# Patient Record
Sex: Female | Born: 1960 | Race: Black or African American | Hispanic: No | Marital: Married | Smoking: Former smoker
Health system: Southern US, Community
[De-identification: ages and names within clinical notes are randomized; demographics above are authoritative.]

## PROBLEM LIST (undated history)

## (undated) DIAGNOSIS — E785 Hyperlipidemia, unspecified: Secondary | ICD-10-CM

## (undated) DIAGNOSIS — I10 Essential (primary) hypertension: Secondary | ICD-10-CM

## (undated) DIAGNOSIS — R32 Unspecified urinary incontinence: Secondary | ICD-10-CM

## (undated) DIAGNOSIS — J45909 Unspecified asthma, uncomplicated: Secondary | ICD-10-CM

## (undated) DIAGNOSIS — K284 Chronic or unspecified gastrojejunal ulcer with hemorrhage: Secondary | ICD-10-CM

## (undated) DIAGNOSIS — Z9289 Personal history of other medical treatment: Secondary | ICD-10-CM

## (undated) DIAGNOSIS — R519 Headache, unspecified: Secondary | ICD-10-CM

## (undated) DIAGNOSIS — J189 Pneumonia, unspecified organism: Secondary | ICD-10-CM

## (undated) DIAGNOSIS — I82409 Acute embolism and thrombosis of unspecified deep veins of unspecified lower extremity: Secondary | ICD-10-CM

## (undated) DIAGNOSIS — K274 Chronic or unspecified peptic ulcer, site unspecified, with hemorrhage: Secondary | ICD-10-CM

## (undated) DIAGNOSIS — R51 Headache: Secondary | ICD-10-CM

## (undated) DIAGNOSIS — J439 Emphysema, unspecified: Secondary | ICD-10-CM

## (undated) HISTORY — DX: Personal history of other medical treatment: Z92.89

## (undated) HISTORY — DX: Chronic or unspecified peptic ulcer, site unspecified, with hemorrhage: K27.4

## (undated) HISTORY — PX: TONSILLECTOMY: SUR1361

## (undated) HISTORY — DX: Headache, unspecified: R51.9

## (undated) HISTORY — PX: LAPAROSCOPIC GASTRIC SLEEVE RESECTION: SHX5895

## (undated) HISTORY — PX: TUBAL LIGATION: SHX77

## (undated) HISTORY — PX: GASTRECTOMY: SHX58

## (undated) HISTORY — DX: Acute embolism and thrombosis of unspecified deep veins of unspecified lower extremity: I82.409

## (undated) HISTORY — DX: Emphysema, unspecified: J43.9

## (undated) HISTORY — PX: FOOT SURGERY: SHX648

## (undated) HISTORY — DX: Headache: R51

## (undated) HISTORY — DX: Chronic or unspecified gastrojejunal ulcer with hemorrhage: K28.4

## (undated) HISTORY — DX: Unspecified urinary incontinence: R32

---

## 1995-06-05 DIAGNOSIS — I82409 Acute embolism and thrombosis of unspecified deep veins of unspecified lower extremity: Secondary | ICD-10-CM

## 1995-06-05 HISTORY — DX: Acute embolism and thrombosis of unspecified deep veins of unspecified lower extremity: I82.409

## 2011-10-24 ENCOUNTER — Ambulatory Visit (HOSPITAL_BASED_OUTPATIENT_CLINIC_OR_DEPARTMENT_OTHER)
Admission: RE | Admit: 2011-10-24 | Discharge: 2011-10-24 | Disposition: A | Discharge status: Home / Self Care | Payer: BC Managed Care – PPO | Source: Ambulatory Visit | Attending: Internal Medicine | Admitting: Internal Medicine

## 2011-10-24 ENCOUNTER — Other Ambulatory Visit (HOSPITAL_BASED_OUTPATIENT_CLINIC_OR_DEPARTMENT_OTHER): Payer: Self-pay | Admitting: Internal Medicine

## 2011-10-24 DIAGNOSIS — M79606 Pain in leg, unspecified: Secondary | ICD-10-CM

## 2011-10-24 DIAGNOSIS — M25579 Pain in unspecified ankle and joints of unspecified foot: Secondary | ICD-10-CM | POA: Insufficient documentation

## 2011-10-24 DIAGNOSIS — M25476 Effusion, unspecified foot: Secondary | ICD-10-CM | POA: Insufficient documentation

## 2011-10-24 DIAGNOSIS — M25473 Effusion, unspecified ankle: Secondary | ICD-10-CM | POA: Insufficient documentation

## 2012-05-05 ENCOUNTER — Other Ambulatory Visit (HOSPITAL_COMMUNITY)
Admission: RE | Admit: 2012-05-05 | Discharge: 2012-05-05 | Disposition: A | Discharge status: Home / Self Care | Payer: BC Managed Care – PPO | Source: Ambulatory Visit | Attending: Obstetrics and Gynecology | Admitting: Obstetrics and Gynecology

## 2012-05-05 DIAGNOSIS — Z1151 Encounter for screening for human papillomavirus (HPV): Secondary | ICD-10-CM | POA: Insufficient documentation

## 2012-05-05 DIAGNOSIS — Z01419 Encounter for gynecological examination (general) (routine) without abnormal findings: Secondary | ICD-10-CM | POA: Insufficient documentation

## 2013-04-13 DIAGNOSIS — E782 Mixed hyperlipidemia: Secondary | ICD-10-CM | POA: Insufficient documentation

## 2013-04-13 DIAGNOSIS — I10 Essential (primary) hypertension: Secondary | ICD-10-CM | POA: Insufficient documentation

## 2013-04-13 DIAGNOSIS — I152 Hypertension secondary to endocrine disorders: Secondary | ICD-10-CM | POA: Insufficient documentation

## 2013-04-13 DIAGNOSIS — J4520 Mild intermittent asthma, uncomplicated: Secondary | ICD-10-CM | POA: Insufficient documentation

## 2013-08-14 ENCOUNTER — Other Ambulatory Visit: Payer: Self-pay | Admitting: Internal Medicine

## 2013-08-14 DIAGNOSIS — Z1231 Encounter for screening mammogram for malignant neoplasm of breast: Secondary | ICD-10-CM

## 2013-08-14 DIAGNOSIS — Z0000 Encounter for general adult medical examination without abnormal findings: Secondary | ICD-10-CM

## 2014-07-16 DIAGNOSIS — Z9884 Bariatric surgery status: Secondary | ICD-10-CM | POA: Insufficient documentation

## 2014-10-05 DIAGNOSIS — E559 Vitamin D deficiency, unspecified: Secondary | ICD-10-CM | POA: Insufficient documentation

## 2015-07-01 ENCOUNTER — Other Ambulatory Visit: Payer: Self-pay | Admitting: Obstetrics and Gynecology

## 2015-07-01 ENCOUNTER — Other Ambulatory Visit (HOSPITAL_COMMUNITY)
Admission: RE | Admit: 2015-07-01 | Discharge: 2015-07-01 | Disposition: A | Discharge status: Home / Self Care | Payer: BLUE CROSS/BLUE SHIELD | Source: Ambulatory Visit | Attending: Obstetrics and Gynecology | Admitting: Obstetrics and Gynecology

## 2015-07-01 DIAGNOSIS — Z01419 Encounter for gynecological examination (general) (routine) without abnormal findings: Secondary | ICD-10-CM | POA: Insufficient documentation

## 2015-07-01 DIAGNOSIS — Z1151 Encounter for screening for human papillomavirus (HPV): Secondary | ICD-10-CM | POA: Insufficient documentation

## 2015-07-04 LAB — CYTOLOGY - PAP

## 2015-07-13 ENCOUNTER — Encounter (HOSPITAL_BASED_OUTPATIENT_CLINIC_OR_DEPARTMENT_OTHER): Payer: Self-pay

## 2015-07-13 ENCOUNTER — Emergency Department (HOSPITAL_BASED_OUTPATIENT_CLINIC_OR_DEPARTMENT_OTHER)
Admission: EM | Admit: 2015-07-13 | Discharge: 2015-07-14 | Disposition: A | Discharge status: Home / Self Care | Payer: BLUE CROSS/BLUE SHIELD | Attending: Emergency Medicine | Admitting: Emergency Medicine

## 2015-07-13 ENCOUNTER — Emergency Department (HOSPITAL_BASED_OUTPATIENT_CLINIC_OR_DEPARTMENT_OTHER): Payer: BLUE CROSS/BLUE SHIELD

## 2015-07-13 DIAGNOSIS — Z8639 Personal history of other endocrine, nutritional and metabolic disease: Secondary | ICD-10-CM | POA: Diagnosis not present

## 2015-07-13 DIAGNOSIS — R079 Chest pain, unspecified: Secondary | ICD-10-CM | POA: Diagnosis not present

## 2015-07-13 DIAGNOSIS — I10 Essential (primary) hypertension: Secondary | ICD-10-CM | POA: Diagnosis not present

## 2015-07-13 DIAGNOSIS — J45901 Unspecified asthma with (acute) exacerbation: Secondary | ICD-10-CM | POA: Diagnosis not present

## 2015-07-13 DIAGNOSIS — Z87891 Personal history of nicotine dependence: Secondary | ICD-10-CM | POA: Insufficient documentation

## 2015-07-13 DIAGNOSIS — Z8701 Personal history of pneumonia (recurrent): Secondary | ICD-10-CM | POA: Insufficient documentation

## 2015-07-13 HISTORY — DX: Pneumonia, unspecified organism: J18.9

## 2015-07-13 HISTORY — DX: Essential (primary) hypertension: I10

## 2015-07-13 HISTORY — DX: Unspecified asthma, uncomplicated: J45.909

## 2015-07-13 HISTORY — DX: Hyperlipidemia, unspecified: E78.5

## 2015-07-13 LAB — CBC
HCT: 40.6 % (ref 36.0–46.0)
HEMOGLOBIN: 13.2 g/dL (ref 12.0–15.0)
MCH: 26.2 pg (ref 26.0–34.0)
MCHC: 32.5 g/dL (ref 30.0–36.0)
MCV: 80.6 fL (ref 78.0–100.0)
PLATELETS: 300 10*3/uL (ref 150–400)
RBC: 5.04 MIL/uL (ref 3.87–5.11)
RDW: 14.4 % (ref 11.5–15.5)
WBC: 6.6 10*3/uL (ref 4.0–10.5)

## 2015-07-13 LAB — BASIC METABOLIC PANEL
ANION GAP: 9 (ref 5–15)
BUN: 27 mg/dL — ABNORMAL HIGH (ref 6–20)
CALCIUM: 9.4 mg/dL (ref 8.9–10.3)
CO2: 26 mmol/L (ref 22–32)
CREATININE: 0.76 mg/dL (ref 0.44–1.00)
Chloride: 106 mmol/L (ref 101–111)
GLUCOSE: 120 mg/dL — AB (ref 65–99)
Potassium: 3.3 mmol/L — ABNORMAL LOW (ref 3.5–5.1)
Sodium: 141 mmol/L (ref 135–145)

## 2015-07-13 LAB — TROPONIN I

## 2015-07-13 MED ORDER — KETOROLAC TROMETHAMINE 30 MG/ML IJ SOLN
30.0000 mg | Freq: Once | INTRAMUSCULAR | Status: AC
  Administered 2015-07-13: 30 mg via INTRAVENOUS
  Filled 2015-07-13: qty 1

## 2015-07-13 NOTE — ED Notes (Signed)
MD at bedside. 

## 2015-07-13 NOTE — ED Notes (Signed)
Pt c/o intermittent left side chest pain and burning with SOB and dizziness for the last two weeks

## 2015-07-13 NOTE — ED Provider Notes (Signed)
CSN: [id number]     Arrival date & time 07/13/15  2000 History   First MD Initiated Contact with Patient 07/13/15 2232     Chief Complaint  Patient presents with  . Chest Pain    HPI   55 year old female presents today with complaints of chest pain. She reports she's had symptoms off and on for the last 2 weeks. She reports that they come and go, not associated with any significant changes. In health status. She reports most recent episode was Saturday, reports that she had some shortness of breath when walking, and sharp burning sensation in her substernal region. Denies any exacerbating activities, nothing making it better. Patient had not tried any medications for the pain. She denies any abdominal pain, belching, change in stool color. Patient notes she has a history of pleurisy, reports that this felt somewhat similar and assumed it would go away on its own. She states that symptoms resolved again after this weekend, but then again returned today. She denies any fever, chills, productive cough, lotion swelling or edema. Patient denies smoking history, no estrogen, history of DVT with pregnancy, no recent surgery, or any other risk factors for DVT PE.   Past Medical History  Diagnosis Date  . Pneumonia   . Asthma   . Hyperlipidemia   . Hypertension    Past Surgical History  Procedure Laterality Date  . Gastrectomy    . Foot surgery    . Tubal ligation     No family history on file. Social History  Substance Use Topics  . Smoking status: Former Games developer  . Smokeless tobacco: None  . Alcohol Use: Yes   OB History    No data available     Review of Systems  All other systems reviewed and are negative.   Allergies  Sulfa antibiotics  Home Medications   Prior to Admission medications   Not on File   BP 115/81 mmHg  Pulse 58  Temp(Src) 98.1 F (36.7 C) (Oral)  Resp 18  Ht  (1.727 m)  Wt 98.884 kg  BMI 33.15 kg/m2  SpO2 95% Physical Exam  Constitutional: She is  oriented to person, place, and time. She appears well-developed and well-nourished.  HENT:  Head: Normocephalic and atraumatic.  Eyes: Conjunctivae are normal. Pupils are equal, round, and reactive to light. Right eye exhibits no discharge. Left eye exhibits no discharge. No scleral icterus.  Neck: Normal range of motion. No JVD present. No tracheal deviation present.  Cardiovascular: Normal rate, regular rhythm, normal heart sounds and intact distal pulses.  Exam reveals no gallop and no friction rub.   No murmur heard. Pulmonary/Chest: Effort normal and breath sounds normal. No stridor. No respiratory distress. She has no wheezes. She has no rales. She exhibits no tenderness.  Abdominal: Soft. She exhibits no distension and no mass. There is no tenderness. There is no rebound and no guarding.  Musculoskeletal:  Lower extremities equal bilateral, nontender to palpation  Neurological: She is alert and oriented to person, place, and time. Coordination normal.  Skin: Skin is warm and dry. No rash noted. No erythema. No pallor.  Psychiatric: She has a normal mood and affect. Her behavior is normal. Judgment and thought content normal.  Nursing note and vitals reviewed.   ED Course  Procedures (including critical care time) Labs Review Labs Reviewed  BASIC METABOLIC PANEL - Abnormal; Notable for the following:    Potassium 3.3 (*)    Glucose, Bld 120 (*)  BUN 27 (*)    All other components within normal limits  CBC  TROPONIN I    Imaging Review Dg Chest 2 View  07/13/2015  CLINICAL DATA:  Chest and LEFT-sided neck pain with cough for 4 days. EXAM: CHEST  2 VIEW COMPARISON:  None. FINDINGS: The heart size and mediastinal contours are within normal limits. Both lungs are clear. The visualized skeletal structures are unremarkable. IMPRESSION: No active cardiopulmonary disease. Electronically Signed   By: Elsie Stain M.D.   On: 07/13/2015 21:16   I have personally reviewed and evaluated  these images and lab results as part of my medical decision-making.   EKG Interpretation None      MDM   Final diagnoses:  Chest pain, unspecified chest pain type    Labs: BMP, CBC, troponin- no significant findings  Imaging: DG chest 2 view negative  Consults:  Therapeutics: Toradol  Discharge Meds:   Assessment/Plan: 55 year old female presents today with chest pain. This is been intermittent over the last several weeks, history of the same prior. She was treated here in the ED with Toradol with symptomatic improvement. She has a negative troponin, normal EKG, negative chest x-ray. She has a heart scores 3, well score of 1.5. I have very low suspicion for any cardiac or pulmonary etiology for patient's pain today. Pain was not severe enough that patient attempted any over-the-counter medications prior to arrival. Patient will be discharged home with instructions follow-up with her primary care provider for reevaluation. She is given strict return precautions, verbalized understanding and agreement for today's plan.           Eyvonne Mechanic, PA-C 07/14/15 0107  Loren Racer, MD 07/16/15 2249

## 2015-07-14 NOTE — Discharge Instructions (Signed)
Chest Wall Pain Chest wall pain is pain in or around the bones and muscles of your chest. Sometimes, an injury causes this pain. Sometimes, the cause may not be known. This pain may take several weeks or longer to get better. HOME CARE Pay attention to any changes in your symptoms. Take these actions to help with your pain:  Rest as told by your doctor.  Avoid activities that cause pain. Try not to use your chest, belly (abdominal), or side muscles to lift heavy things.  If directed, apply ice to the painful area:  Put ice in a plastic bag.  Place a towel between your skin and the bag.  Leave the ice on for 20 minutes, 2-3 times per day.  Take over-the-counter and prescription medicines only as told by your doctor.  Do not use tobacco products, including cigarettes, chewing tobacco, and e-cigarettes. If you need help quitting, ask your doctor.  Keep all follow-up visits as told by your doctor. This is important. GET HELP IF:  You have a fever.  Your chest pain gets worse.  You have new symptoms. GET HELP RIGHT AWAY IF:  You feel sick to your stomach (nauseous) or you throw up (vomit).  You feel sweaty or light-headed.  You have a cough with phlegm (sputum) or you cough up blood.  You are short of breath.   This information is not intended to replace advice given to you by your health care provider. Make sure you discuss any questions you have with your health care provider.   Document Released: 11/07/2007 Document Revised: 02/09/2015 Document Reviewed: 08/16/2014 Elsevier Interactive Patient Education 2016 Else[employer]  Chest Wall Pain Chest wall pain is pain in or around the bones and muscles of your chest. Sometimes, an injury causes this pain. Sometimes, the cause may not be known. This pain may take several weeks or longer to get better. HOME CARE Pay attention to any changes in your symptoms. Take these actions to help with your pain:  Rest as told by your  doctor.  Avoid activities that cause pain. Try not to use your chest, belly (abdominal), or side muscles to lift heavy things.  If directed, apply ice to the painful area:  Put ice in a plastic bag.  Place a towel between your skin and the bag.  Leave the ice on for 20 minutes, 2-3 times per day.  Take over-the-counter and prescription medicines only as told by your doctor.  Do not use tobacco products, including cigarettes, chewing tobacco, and e-cigarettes. If you need help quitting, ask your doctor.  Keep all follow-up visits as told by your doctor. This is important. GET HELP IF:  You have a fever.  Your chest pain gets worse.  You have new symptoms. GET HELP RIGHT AWAY IF:  You feel sick to your stomach (nauseous) or you throw up (vomit).  You feel sweaty or light-headed.  You have a cough with phlegm (sputum) or you cough up blood.  You are short of breath.   This information is not intended to replace advice given to you by your health care provider. Make sure you discuss any questions you have with your health care provider.   Document Released: 11/07/2007 Document Revised: 02/09/2015 Document Reviewed: 08/16/2014 Elsevier Interactive Patient Education [employer]

## 2015-07-18 ENCOUNTER — Other Ambulatory Visit: Payer: Self-pay | Admitting: Obstetrics and Gynecology

## 2015-11-30 ENCOUNTER — Ambulatory Visit: Payer: BLUE CROSS/BLUE SHIELD | Admitting: Allergy and Immunology

## 2015-12-07 ENCOUNTER — Other Ambulatory Visit: Payer: Self-pay | Admitting: Obstetrics and Gynecology

## 2016-02-09 ENCOUNTER — Other Ambulatory Visit: Payer: Self-pay | Admitting: Internal Medicine

## 2016-02-09 DIAGNOSIS — M5442 Lumbago with sciatica, left side: Principal | ICD-10-CM

## 2016-02-09 DIAGNOSIS — M5441 Lumbago with sciatica, right side: Secondary | ICD-10-CM

## 2016-02-17 ENCOUNTER — Other Ambulatory Visit: Payer: BLUE CROSS/BLUE SHIELD

## 2016-02-24 ENCOUNTER — Ambulatory Visit (HOSPITAL_COMMUNITY): Admission: RE | Admit: 2016-02-24 | Payer: BLUE CROSS/BLUE SHIELD | Source: Ambulatory Visit

## 2016-06-04 HISTORY — PX: DILATION AND CURETTAGE OF UTERUS: SHX78

## 2016-06-05 DIAGNOSIS — M5116 Intervertebral disc disorders with radiculopathy, lumbar region: Secondary | ICD-10-CM | POA: Insufficient documentation

## 2016-07-02 ENCOUNTER — Other Ambulatory Visit (HOSPITAL_COMMUNITY)
Admission: RE | Admit: 2016-07-02 | Discharge: 2016-07-02 | Disposition: A | Discharge status: Home / Self Care | Payer: BLUE CROSS/BLUE SHIELD | Source: Ambulatory Visit | Attending: Obstetrics and Gynecology | Admitting: Obstetrics and Gynecology

## 2016-07-02 ENCOUNTER — Other Ambulatory Visit: Payer: Self-pay | Admitting: Obstetrics and Gynecology

## 2016-07-02 DIAGNOSIS — Z1151 Encounter for screening for human papillomavirus (HPV): Secondary | ICD-10-CM | POA: Insufficient documentation

## 2016-07-02 DIAGNOSIS — Z113 Encounter for screening for infections with a predominantly sexual mode of transmission: Secondary | ICD-10-CM | POA: Insufficient documentation

## 2016-07-02 DIAGNOSIS — Z01419 Encounter for gynecological examination (general) (routine) without abnormal findings: Secondary | ICD-10-CM | POA: Insufficient documentation

## 2016-07-04 LAB — CYTOLOGY - PAP
Bacterial vaginitis: POSITIVE — AB
DIAGNOSIS: NEGATIVE
HPV (WINDOPATH): NOT DETECTED

## 2016-08-28 ENCOUNTER — Other Ambulatory Visit: Payer: Self-pay | Admitting: Obstetrics and Gynecology

## 2017-07-03 ENCOUNTER — Other Ambulatory Visit: Payer: Self-pay | Admitting: Obstetrics and Gynecology

## 2017-07-03 ENCOUNTER — Other Ambulatory Visit (HOSPITAL_COMMUNITY)
Admission: RE | Admit: 2017-07-03 | Discharge: 2017-07-03 | Disposition: A | Discharge status: Home / Self Care | Payer: BLUE CROSS/BLUE SHIELD | Source: Ambulatory Visit | Attending: Obstetrics and Gynecology | Admitting: Obstetrics and Gynecology

## 2017-07-03 DIAGNOSIS — Z124 Encounter for screening for malignant neoplasm of cervix: Secondary | ICD-10-CM | POA: Diagnosis not present

## 2017-07-04 LAB — CYTOLOGY - PAP
Diagnosis: NEGATIVE
HPV: NOT DETECTED

## 2017-07-29 ENCOUNTER — Ambulatory Visit (INDEPENDENT_AMBULATORY_CARE_PROVIDER_SITE_OTHER): Payer: BLUE CROSS/BLUE SHIELD | Admitting: Family Medicine

## 2017-07-29 ENCOUNTER — Ambulatory Visit (INDEPENDENT_AMBULATORY_CARE_PROVIDER_SITE_OTHER): Payer: BLUE CROSS/BLUE SHIELD

## 2017-07-29 ENCOUNTER — Encounter: Payer: Self-pay | Admitting: Family Medicine

## 2017-07-29 DIAGNOSIS — M25562 Pain in left knee: Secondary | ICD-10-CM | POA: Diagnosis not present

## 2017-07-29 MED ORDER — DICLOFENAC SODIUM 75 MG PO TBEC: 75.0000 mg | DELAYED_RELEASE_TABLET | Freq: Two times a day (BID) | ORAL | 0 refills | Status: DC

## 2017-07-29 NOTE — Assessment & Plan Note (Signed)
Chronic, intermittent issue. Probable internal derangement. Xray today. Refer to Dr. Jordan LikesSchmitz- re:  Further evaluation, ? Cortisone injections. The patient indicates understanding of these issues and agrees with the plan.

## 2017-07-29 NOTE — Progress Notes (Signed)
Subjective:   Patient ID: Melinda Bruce, female    DOB: Dec 05, 1960, 57 y.o.   MRN: [id number]  Yvonda Fouty is a pleasant 57 y.o. year old female who presents to clinic today with New Patient (Initial Visit) (Patient is here today to establish care as a new patient. She had flu shot in Oct with work.  Her last Colonoscopy was approx 12-years-ago and had a bleeding ulcer in [place].  Records have been requested.) and Knee Pain (Patient is C/O left knee pain.  She went to an Urgent Care 2.6.19 for the pain.  It was painful to touch and inflammed. Tx with Diclofenac 75mg  1qd x14d which helped but it is now back.  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  Pain started yeterday and she took Meloxicam last hs which helped some.  States there is a knot on the medial side. )  on 07/29/2017  HPI:  Left knee pain- went to [facility] on 07/10/17 for this complaint. Note reviewed. At that time, had 4 days of left knee pain and swelling. No known injury.   Sent home with 14 day course of oral and topical Voltaren. Completed course of oral, did not pick up the voltaren- insurance issue. Advised to follow up with ortho.  Pain restarted again yesterday- took some meloxicam which did help a little. She says that she can feel a knot on the medial side.   Current Outpatient Medications on File Prior to Visit  Medication Sig Dispense Refill  . atenolol-chlorthalidone (TENORETIC) 50-25 MG tablet Take 1 tablet by mouth daily at 2 PM.    . Calcium Citrate 250 MG TABS Take by mouth.    . Cholecalciferol (VITAMIN D-3) 5000 units TABS Take 1 tablet by mouth daily.    Marland Kitchen CONTRAVE 8-90 MG TB12 UAD  0  . estradiol (ESTRACE) 1 MG tablet Take 1 tab three times weekly  0  . meloxicam (MOBIC) 15 MG tablet Take 1 tablet by mouth daily at 2 PM.    . montelukast (SINGULAIR) 10 MG tablet Take 1 tablet by mouth daily.  0  . Multiple Vitamin (MULTIVITAMIN WITH MINERALS) TABS tablet Take 1 tablet by mouth  daily.    . pravastatin (PRAVACHOL) 40 MG tablet Take 1 tablet by mouth daily.  1  . tolterodine (DETROL) 2 MG tablet Take 1 tablet by mouth 2 (two) times daily as needed.     No current facility-administered medications on file prior to visit.     Allergies  Allergen Reactions  . Almond Oil Anaphylaxis  . Sulfa Antibiotics Itching    Past Medical History:  Diagnosis Date  . Asthma   . Hyperlipidemia   . Hypertension   . Pneumonia     Past Surgical History:  Procedure Laterality Date  . FOOT SURGERY    . GASTRECTOMY    . TUBAL LIGATION      No family history on file.  Social History   Socioeconomic History  . Marital status: Married    Spouse name: Not on file  . Number of children: Not on file  . Years of education: Not on file  . Highest education level: Not on file  Social Needs  . Financial resource strain: Not on file  . Food insecurity - worry: Not on file  . Food insecurity - inability: Not on file  . Transportation needs - medical: Not on file  . Transportation needs - non-medical: Not on file  Occupational  History  . Not on file  Tobacco Use  . Smoking status: Former Games developermoker  . Smokeless tobacco: Never Used  Substance and Sexual Activity  . Alcohol use: Yes  . Drug use: No  . Sexual activity: Yes    Partners: Male  Other Topics Concern  . Not on file  Social History Narrative  . Not on file   The PMH, PSH, Social History, Family History, Medications, and allergies [facility], and have been updated if relevant.   Review of Systems  Constitutional: Negative.   HENT: Negative.   Eyes: Negative.   Respiratory: Negative.   Cardiovascular: Negative.   Gastrointestinal: Negative.   Endocrine: Negative.   Genitourinary: Negative.   Musculoskeletal: Positive for arthralgias and joint swelling. Negative for gait problem.  Allergic/Immunologic: Negative.   Neurological: Negative.   Hematological: Negative.     Psychiatric/Behavioral: Negative.   All other systems reviewed and are negative.      Objective:    BP 116/76 (BP Location: Left Arm, Patient Position: Sitting, Cuff Size: Large)   Pulse 64   Temp 98.6 F (37 C) (Oral)   Ht 5\' 8"  (1.727 m)   Wt 246 lb 12.8 oz (111.9 kg)   SpO2 100%   BMI 37.53 kg/m    Physical Exam  Constitutional: She is oriented to person, place, and time. She appears well-developed and well-nourished. No distress.  HENT:  Head: Normocephalic and atraumatic.  Eyes: Conjunctivae are normal.  Cardiovascular: Normal rate.  Pulmonary/Chest: Effort normal.  Musculoskeletal:       Left knee: She exhibits decreased range of motion, swelling, effusion and abnormal meniscus. She exhibits no ecchymosis, no deformity, no laceration, no erythema, normal alignment, no LCL laxity, normal patellar mobility, no bony tenderness and no MCL laxity.  Neurological: She is alert and oriented to person, place, and time. No cranial nerve deficit.  Skin: Skin is warm and dry. She is not diaphoretic.  Psychiatric: She has a normal mood and affect. Her behavior is normal. Judgment and thought content normal.  Nursing note and vitals reviewed.         Assessment & Plan:   Acute pain of left knee No Follow-up on file.

## 2017-07-29 NOTE — Patient Instructions (Addendum)
Great to see you. Please schedule an appointment to see Dr. Jordan LikesSchmitz.  I will call you with your xray results.

## 2017-08-07 ENCOUNTER — Ambulatory Visit (INDEPENDENT_AMBULATORY_CARE_PROVIDER_SITE_OTHER): Payer: BLUE CROSS/BLUE SHIELD | Admitting: Family Medicine

## 2017-08-07 ENCOUNTER — Ambulatory Visit: Payer: Self-pay

## 2017-08-07 ENCOUNTER — Encounter: Payer: Self-pay | Admitting: Family Medicine

## 2017-08-07 VITALS — BP 128/76 | HR 74 | Temp 98.6°F | Ht 68.0 in | Wt 246.0 lb

## 2017-08-07 DIAGNOSIS — M25562 Pain in left knee: Secondary | ICD-10-CM

## 2017-08-07 DIAGNOSIS — G8929 Other chronic pain: Secondary | ICD-10-CM | POA: Diagnosis not present

## 2017-08-07 MED ORDER — DICLOFENAC SODIUM 2 % TD SOLN: 1.0000 "application " | Freq: Two times a day (BID) | TRANSDERMAL | 3 refills | Status: DC

## 2017-08-07 NOTE — Progress Notes (Signed)
Melinda Bruce - 57 y.o. female MRN [id number]  Date of birth: 03-12-1961  SUBJECTIVE:  Including CC & ROS.  Chief Complaint  Patient presents with  . Left knee pain    Melinda Bruce is a 57 y.o. female that is presenting with left knee pain. Pain is chronic in nature, increasing over the past month. Pain is located at the medial aspect of her left knee. Pain is described as an intermittent stabbing pain. Denies tingling or numbness. Flexion and extension trigger mild pain. She has been applying ice and taking Voltaren with some improvement. She was seen at  Urgent care on 07/10/17. Denies injury or any trauma to her knee.   Independent review of her left knee x-rays from 2/25 shows degenerative changes of the PF joint.    Review of Systems  Constitutional: Negative for fever.  HENT: Positive for congestion.   Musculoskeletal: Positive for arthralgias. Negative for gait problem.  Skin: Negative for color change.  Neurological: Negative for weakness.  Psychiatric/Behavioral: Negative for agitation.    HISTORY: Past Medical, Surgical, Social, and Family History Reviewed & Updated per EMR.   Pertinent Historical Findings include:  Past Medical History:  Diagnosis Date  . Asthma   . Bleeding ulcer   . DVT (deep venous thrombosis) (HCC) 1997   After giving birth to 1st daughter  . Emphysema of lung (HCC)   . Frequent headaches   . History of positive PPD   . Hyperlipidemia   . Hypertension   . Pneumonia   . Urinary incontinence     Past Surgical History:  Procedure Laterality Date  . DILATION AND CURETTAGE OF UTERUS  2018  . FOOT SURGERY    . GASTRECTOMY    . TONSILLECTOMY    . TUBAL LIGATION      Allergies  Allergen Reactions  . Almond Oil Anaphylaxis  . Sulfa Antibiotics Itching    Family History  Problem Relation Age of Onset  . Arthritis Mother   . Asthma Mother   . Diabetes Mother   . Early death Mother   . Heart attack Mother   . Heart  disease Mother   . Hyperlipidemia Mother   . Kidney disease Mother   . Hypertension Mother   . Miscarriages / [place] Mother   . Stroke Mother   . Arthritis Father   . Diabetes Father   . Heart attack Father   . Heart disease Father   . Hyperlipidemia Father   . Kidney disease Father   . Hypertension Father   . Stroke Father   . Arthritis Sister   . Alcohol abuse Brother   . Arthritis Brother   . Asthma Brother   . Diabetes Brother   . Drug abuse Brother   . Asthma Daughter   . Depression Daughter   . Early death Maternal Grandmother   . Early death Maternal Grandfather   . Cancer Maternal Grandfather   . Arthritis Sister   . Hyperlipidemia Sister   . Asthma Daughter   . Hearing loss Daughter   . Depression Daughter   . Learning disabilities Daughter      Social History   Socioeconomic History  . Marital status: Married    Spouse name: Not on file  . Number of children: Not on file  . Years of education: Not on file  . Highest education level: Not on file  Social Needs  . Financial resource strain: Not on file  . Food insecurity - worry:  Not on file  . Food insecurity - inability: Not on file  . Transportation needs - medical: Not on file  . Transportation needs - non-medical: Not on file  Occupational History  . Not on file  Tobacco Use  . Smoking status: Former Games developermoker  . Smokeless tobacco: Never Used  Substance and Sexual Activity  . Alcohol use: Yes  . Drug use: No  . Sexual activity: Yes    Partners: Male  Other Topics Concern  . Not on file  Social History Narrative  . Not on file     PHYSICAL EXAM:  VS: BP 128/76 (BP Location: Left Arm, Patient Position: Sitting, Cuff Size: Normal)   Pulse 74   Temp 98.6 F (37 C) (Oral)   Ht 5\' 8"  (1.727 m)   Wt 246 lb (111.6 kg)   SpO2 100%   BMI 37.40 kg/m  Physical Exam Gen: NAD, alert, cooperative with exam, well-appearing ENT: normal lips, normal nasal mucosa,  Eye: normal EOM, normal  conjunctiva and lids CV:  no edema, +2 pedal pulses   Resp: no accessory muscle use, non-labored,  Skin: no rashes, no areas of induration  Neuro: normal tone, normal sensation to touch Psych:  normal insight, alert and oriented MSK:  Left knee:  No effusion  No TTp of the medial or lateral joint line  Normal flexion and extension strength to resistance  Normal McMurray's test  Normal gait.  Neurovascularly intact   Limited ultrasound: left knee:  No effusion  Normal appearing medial and lateral joint line.  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 May have a component of arthritis but medial joint look reassuring. 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 No pain today  - Pennsaid  - counseled on HEP  - if pain worsens then consider injection.

## 2017-08-07 NOTE — Assessment & Plan Note (Signed)
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 May have a component of arthritis but medial joint look reassuring. 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 No pain today  - Pennsaid  - counseled on HEP  - if pain worsens then consider injection.

## 2017-08-07 NOTE — Patient Instructions (Signed)
Please try the exercises   Please use the Pennsaid so you don't have to take a medication by mouth.   Take tylenol 650 mg three times a day is the best evidence based medicine we have for arthritis.   Glucosamine sulfate 750mg  twice a day is a supplement that has been shown to help moderate to severe arthritis.  Vitamin D 2000 IU daily  Fish oil 2 grams daily.   Tumeric 500mg  twice daily.   Capsaicin topically up to four times a day may also help with pain.  Cortisone injections are an option if these interventions do not seem to make a difference or need more relief.   If cortisone injections do not help, there are different types of shots that may help but they take longer to take effect.  We can discuss this at follow up.   It's important that you continue to stay active.  Controlling your weight is important.   Consider physical therapy to strengthen muscles around the joint that hurts to take pressure off of the joint itself.  Follow up with me if your pain worsens and we can perform an injection.

## 2017-12-02 DIAGNOSIS — E1169 Type 2 diabetes mellitus with other specified complication: Secondary | ICD-10-CM | POA: Insufficient documentation

## 2018-04-09 DIAGNOSIS — I83893 Varicose veins of bilateral lower extremities with other complications: Secondary | ICD-10-CM | POA: Insufficient documentation

## 2018-09-19 ENCOUNTER — Telehealth: Payer: Self-pay | Admitting: Family Medicine

## 2018-09-19 NOTE — Telephone Encounter (Signed)
Called and talked to patient. Patient has changed doctors and no longer sees Dr. Dayton Martes.

## 2019-01-15 ENCOUNTER — Emergency Department (HOSPITAL_BASED_OUTPATIENT_CLINIC_OR_DEPARTMENT_OTHER)
Admission: EM | Admit: 2019-01-15 | Discharge: 2019-01-15 | Disposition: A | Discharge status: Home / Self Care | Payer: BC Managed Care – PPO | Attending: Emergency Medicine | Admitting: Emergency Medicine

## 2019-01-15 ENCOUNTER — Encounter (HOSPITAL_BASED_OUTPATIENT_CLINIC_OR_DEPARTMENT_OTHER): Payer: Self-pay

## 2019-01-15 ENCOUNTER — Other Ambulatory Visit: Payer: Self-pay

## 2019-01-15 ENCOUNTER — Emergency Department (HOSPITAL_BASED_OUTPATIENT_CLINIC_OR_DEPARTMENT_OTHER): Payer: BC Managed Care – PPO

## 2019-01-15 DIAGNOSIS — W19XXXA Unspecified fall, initial encounter: Secondary | ICD-10-CM

## 2019-01-15 DIAGNOSIS — Z791 Long term (current) use of non-steroidal anti-inflammatories (NSAID): Secondary | ICD-10-CM | POA: Diagnosis not present

## 2019-01-15 DIAGNOSIS — S39012A Strain of muscle, fascia and tendon of lower back, initial encounter: Secondary | ICD-10-CM | POA: Insufficient documentation

## 2019-01-15 DIAGNOSIS — Y929 Unspecified place or not applicable: Secondary | ICD-10-CM | POA: Insufficient documentation

## 2019-01-15 DIAGNOSIS — Z87891 Personal history of nicotine dependence: Secondary | ICD-10-CM | POA: Diagnosis not present

## 2019-01-15 DIAGNOSIS — Y9389 Activity, other specified: Secondary | ICD-10-CM | POA: Insufficient documentation

## 2019-01-15 DIAGNOSIS — S3992XA Unspecified injury of lower back, initial encounter: Secondary | ICD-10-CM | POA: Diagnosis present

## 2019-01-15 DIAGNOSIS — I10 Essential (primary) hypertension: Secondary | ICD-10-CM | POA: Diagnosis not present

## 2019-01-15 DIAGNOSIS — W07XXXA Fall from chair, initial encounter: Secondary | ICD-10-CM | POA: Diagnosis not present

## 2019-01-15 DIAGNOSIS — M25572 Pain in left ankle and joints of left foot: Secondary | ICD-10-CM | POA: Insufficient documentation

## 2019-01-15 DIAGNOSIS — M25562 Pain in left knee: Secondary | ICD-10-CM | POA: Insufficient documentation

## 2019-01-15 DIAGNOSIS — S20212A Contusion of left front wall of thorax, initial encounter: Secondary | ICD-10-CM | POA: Diagnosis not present

## 2019-01-15 DIAGNOSIS — Z79899 Other long term (current) drug therapy: Secondary | ICD-10-CM | POA: Diagnosis not present

## 2019-01-15 DIAGNOSIS — J45909 Unspecified asthma, uncomplicated: Secondary | ICD-10-CM | POA: Insufficient documentation

## 2019-01-15 DIAGNOSIS — Y999 Unspecified external cause status: Secondary | ICD-10-CM | POA: Diagnosis not present

## 2019-01-15 MED ORDER — METHOCARBAMOL 500 MG PO TABS: 500.0000 mg | ORAL_TABLET | Freq: Four times a day (QID) | ORAL | 0 refills | Status: DC | PRN

## 2019-01-15 MED ORDER — IBUPROFEN 400 MG PO TABS: 400.0000 mg | ORAL_TABLET | Freq: Four times a day (QID) | ORAL | 0 refills | Status: DC | PRN

## 2019-01-15 MED ORDER — IBUPROFEN 400 MG PO TABS
400.0000 mg | ORAL_TABLET | Freq: Once | ORAL | Status: AC
Start: 2019-01-15 — End: 2019-01-15
  Administered 2019-01-15: 400 mg via ORAL
  Filled 2019-01-15: qty 1

## 2019-01-15 NOTE — ED Notes (Signed)
ED Provider at bedside. 

## 2019-01-15 NOTE — ED Provider Notes (Signed)
[facility] EMERGENCY DEPARTMENT Provider Note   CSN: [id number] Arrival date & time: 01/15/19  1909    History   Chief Complaint Chief Complaint  Patient presents with  . Fall    HPI Melinda Bruce is a 58 y.o. female.     HPI Patient reports that she was helping her daughter move and was standing on a chair to hang some lights.  She reports she thought her daughter was holding the chair but then she was not in the chair toppled over.  She reports she landed on her left knee and side.  She reports the chair then landed on her back.  Patient reports she has pain in her lower back on the left side as well as around her upper thoracic area on the left.  She also has some discomfort in the left knee and the left ankle.  These are worse with movement.  She denies any numbness tingling or weakness feeling into the leg.  She denies any abdominal pain.  No shortness of breath.  Patient did not strike her head.  No loss of consciousness.  No neck pain.  Patient does not take blood thinners.  She did not try anything for pain prior to arrival. Past Medical History:  Diagnosis Date  . Asthma   . Bleeding ulcer   . DVT (deep venous thrombosis) (HCC) 1997   After giving birth to 1st daughter  . Frequent headaches   . History of positive PPD   . Hyperlipidemia   . Hypertension   . Pneumonia   . Urinary incontinence     Patient Active Problem List   Diagnosis Date Noted  . Left knee pain 07/29/2017    Past Surgical History:  Procedure Laterality Date  . DILATION AND CURETTAGE OF UTERUS  2018  . FOOT SURGERY    . LAPAROSCOPIC GASTRIC SLEEVE RESECTION    . TONSILLECTOMY    . TUBAL LIGATION       OB History   No obstetric history on file.      Home Medications    Prior to Admission medications   Medication Sig Start Date End Date Taking? Authorizing Provider  atenolol-chlorthalidone (TENORETIC) 50-25 MG tablet Take 1 tablet by mouth daily at 2 PM. 10/24/15    [provider]  benzonatate (TESSALON) 100 MG capsule  08/06/17   [provider]  Calcium Citrate 250 MG TABS Take by mouth.    [provider]  Cholecalciferol (VITAMIN D-3) 5000 units TABS Take 1 tablet by mouth daily.    [provider]  CONTRAVE 8-90 MG TB12 UAD 07/04/17   [provider]  diclofenac (VOLTAREN) 75 MG EC tablet Take 1 tablet (75 mg total) by mouth 2 (two) times daily. 07/29/17   Dianne DunAron, Talia M, MD  Diclofenac Sodium (PENNSAID) 2 % SOLN Place 1 application onto the skin 2 (two) times daily. 08/07/17   Myra RudeSchmitz, Jeremy E, MD  estradiol (ESTRACE) 1 MG tablet Take 1 tab three times weekly 07/18/17   [provider]  ibuprofen (ADVIL) 400 MG tablet Take 1 tablet (400 mg total) by mouth every 6 (six) hours as needed. 01/15/19   Arby BarrettePfeiffer, Madlynn Lundeen, MD  meloxicam (MOBIC) 15 MG tablet Take 1 tablet by mouth daily at 2 PM. 04/10/17   [provider]  methocarbamol (ROBAXIN) 500 MG tablet Take 1 tablet (500 mg total) by mouth every 6 (six) hours as needed for muscle spasms. 01/15/19   Arby BarrettePfeiffer, Criselda Starke, MD  montelukast (SINGULAIR) 10 MG tablet Take 1 tablet by mouth daily. 05/09/17   [provider]  Multiple Vitamin (MULTIVITAMIN WITH MINERALS) TABS tablet Take 1 tablet by mouth daily.    [provider]  oseltamivir (TAMIFLU) 75 MG capsule  08/06/17   [provider]  pravastatin (PRAVACHOL) 40 MG tablet Take 1 tablet by mouth daily. 06/17/17   [provider]  tolterodine (DETROL) 2 MG tablet Take 1 tablet by mouth 2 (two) times daily as needed.    [provider]    Family History Family History  Problem Relation Age of Onset  . Arthritis Mother   . Asthma Mother   . Diabetes Mother   . Early death Mother   . Heart attack Mother   . Heart disease Mother   . Hyperlipidemia Mother   . Kidney disease Mother   . Hypertension Mother   . Miscarriages / [place] Mother   . Stroke Mother    . Arthritis Father   . Diabetes Father   . Heart attack Father   . Heart disease Father   . Hyperlipidemia Father   . Kidney disease Father   . Hypertension Father   . Stroke Father   . Arthritis Sister   . Alcohol abuse Brother   . Arthritis Brother   . Asthma Brother   . Diabetes Brother   . Drug abuse Brother   . Asthma Daughter   . Depression Daughter   . Early death Maternal Grandmother   . Early death Maternal Grandfather   . Cancer Maternal Grandfather   . Arthritis Sister   . Hyperlipidemia Sister   . Asthma Daughter   . Hearing loss Daughter   . Depression Daughter   . Learning disabilities Daughter     Social History Social History   Tobacco Use  . Smoking status: Former Research scientist (life sciences)  . Smokeless tobacco: Never Used  Substance Use Topics  . Alcohol use: Yes    Comment: occ  . Drug use: No     Allergies   Almond oil and Sulfa antibiotics   Review of Systems Review of Systems 10 Systems reviewed and are negative for acute change except as noted in the HPI.   Physical Exam Updated Vital Signs BP 125/66 (BP Location: Left Arm)   Pulse 72   Temp 98.1 F (36.7 C) (Oral)   Resp 18   Ht 5\' 8"  (1.727 m)   Wt 114.8 kg   LMP 09/20/2011   SpO2 100%   BMI 38.47 kg/m   Physical Exam Constitutional:      Comments: Alert and nontoxic.  No respiratory distress.  GCS 15.  Moves coordinated purposeful symmetric.  HENT:     Head: Normocephalic and atraumatic.  Eyes:     Extraocular Movements: Extraocular movements intact.  Neck:     Musculoskeletal: Neck supple.  Cardiovascular:     Rate and Rhythm: Normal rate and regular rhythm.  Pulmonary:     Effort: Pulmonary effort is normal.     Breath sounds: Normal breath sounds.     Comments: Patient is a moderate reproducible pain to palpation along the left thorax around the fifth through eighth ribs.  No crepitus.  No visible bruising.  No CVA tenderness. Abdominal:     General: There is no distension.      Palpations: Abdomen is soft.     Tenderness: There is no abdominal tenderness. There is no guarding.     Comments: No intra-abdominal tenderness.  No hepatosplenomegaly.  No CVA tenderness.  Musculoskeletal: Normal range of motion.        General: No swelling.     Comments: Patient has normal range of motion.  She is up and moving about in the room as I come in.  She can go from supine to sitting.  No limitations.  She does have some discomfort to palpation around the left SI joint and iliac crest.  Patient reports some discomfort at her left knee but no effusion no abrasion with normal range of motion.  No effusion or deformity of the left ankle.  Patient has intact range of motion hip knee and ankle.  Neurological:     General: No focal deficit present.     Mental Status: She is oriented to person, place, and time.     Coordination: Coordination normal.  Psychiatric:        Mood and Affect: Mood normal.      ED Treatments / Results  Labs (all labs ordered are listed, but only abnormal results are displayed) Labs Reviewed - No data to display  EKG None  Radiology Dg Ribs Unilateral W/chest Left  Result Date: 01/15/2019 CLINICAL DATA:  58 year old female with fall and left chest wall pain. EXAM: LEFT RIBS AND CHEST - 3+ VIEW COMPARISON:  None. FINDINGS: The lungs are clear. There is no pleural effusion or pneumothorax. The cardiac silhouette is within normal limits. No acute osseous pathology. No definite rib fracture. Surgical suture noted in the upper abdomen. IMPRESSION: Negative. Electronically Signed   By: Elgie CollardArash  Radparvar M.D.   On: 01/15/2019 21:17   Dg Pelvis 1-2 Views  Result Date: 01/15/2019 CLINICAL DATA:  Fall with left sided pain EXAM: PELVIS - 1-2 VIEW COMPARISON:  None. FINDINGS: There is no evidence of pelvic fracture or diastasis. No pelvic bone lesions are seen. IMPRESSION: Negative. Electronically Signed   By: Jasmine PangKim  Fujinaga M.D.   On: 01/15/2019 21:19     Procedures Procedures (including critical care time)  Medications Ordered in ED Medications  ibuprofen (ADVIL) tablet 400 mg (400 mg Oral Given 01/15/19 2117)     Initial Impression / Assessment and Plan / ED Course  I have reviewed the triage vital signs and the nursing notes.  Pertinent labs & imaging results that were available during my care of the patient were reviewed by me and considered in my medical decision making (see chart for details).       Patient is clinically well in appearance.  All movements are intact.  Patient has findings consistent with contusion and strain.  No neurologic dysfunction.  At this time will have her take ibuprofen and Robaxin for pain control.  Return precautions reviewed.  Final Clinical Impressions(s) / ED Diagnoses   Final diagnoses:  Fall, initial encounter  Strain of lumbar region, initial encounter  Contusion of left chest wall, initial encounter    ED Discharge Orders         Ordered    ibuprofen (ADVIL) 400 MG tablet  Every 6 hours PRN     01/15/19 2129    methocarbamol (ROBAXIN) 500 MG tablet  Every 6 hours PRN     01/15/19 2129           Arby BarrettePfeiffer, Taylin Leder, MD 01/15/19 2131

## 2019-01-15 NOTE — Discharge Instructions (Signed)
1.  Take ibuprofen with some food 3 times a day for your baseline pain control.  You may take Robaxin as a muscle relaxer if needed.  You will likely be more sore in 2 to 5 days. 2.  Return to the emergency department if you develop difficulty breathing, abdominal pain or any worsening symptoms. 3.  Schedule a follow-up with your doctor in approximately 3 days.

## 2019-01-15 NOTE — ED Triage Notes (Signed)
Pt states she fell out of a chair and another chair fell on her ~5pm-pain left UE, left LE, left lower back and buttock-NAD-steady gait

## 2019-06-01 DIAGNOSIS — E119 Type 2 diabetes mellitus without complications: Secondary | ICD-10-CM | POA: Insufficient documentation

## 2021-06-06 IMAGING — CR LEFT RIBS AND CHEST - 3+ VIEW
4 series · 4 of 4 positions shown · non-contrast
Comparison: None.

CLINICAL DATA: 58-year-old female with fall and left chest wall
pain.

EXAM:
LEFT RIBS AND CHEST - 3+ VIEW

[w chest pa]
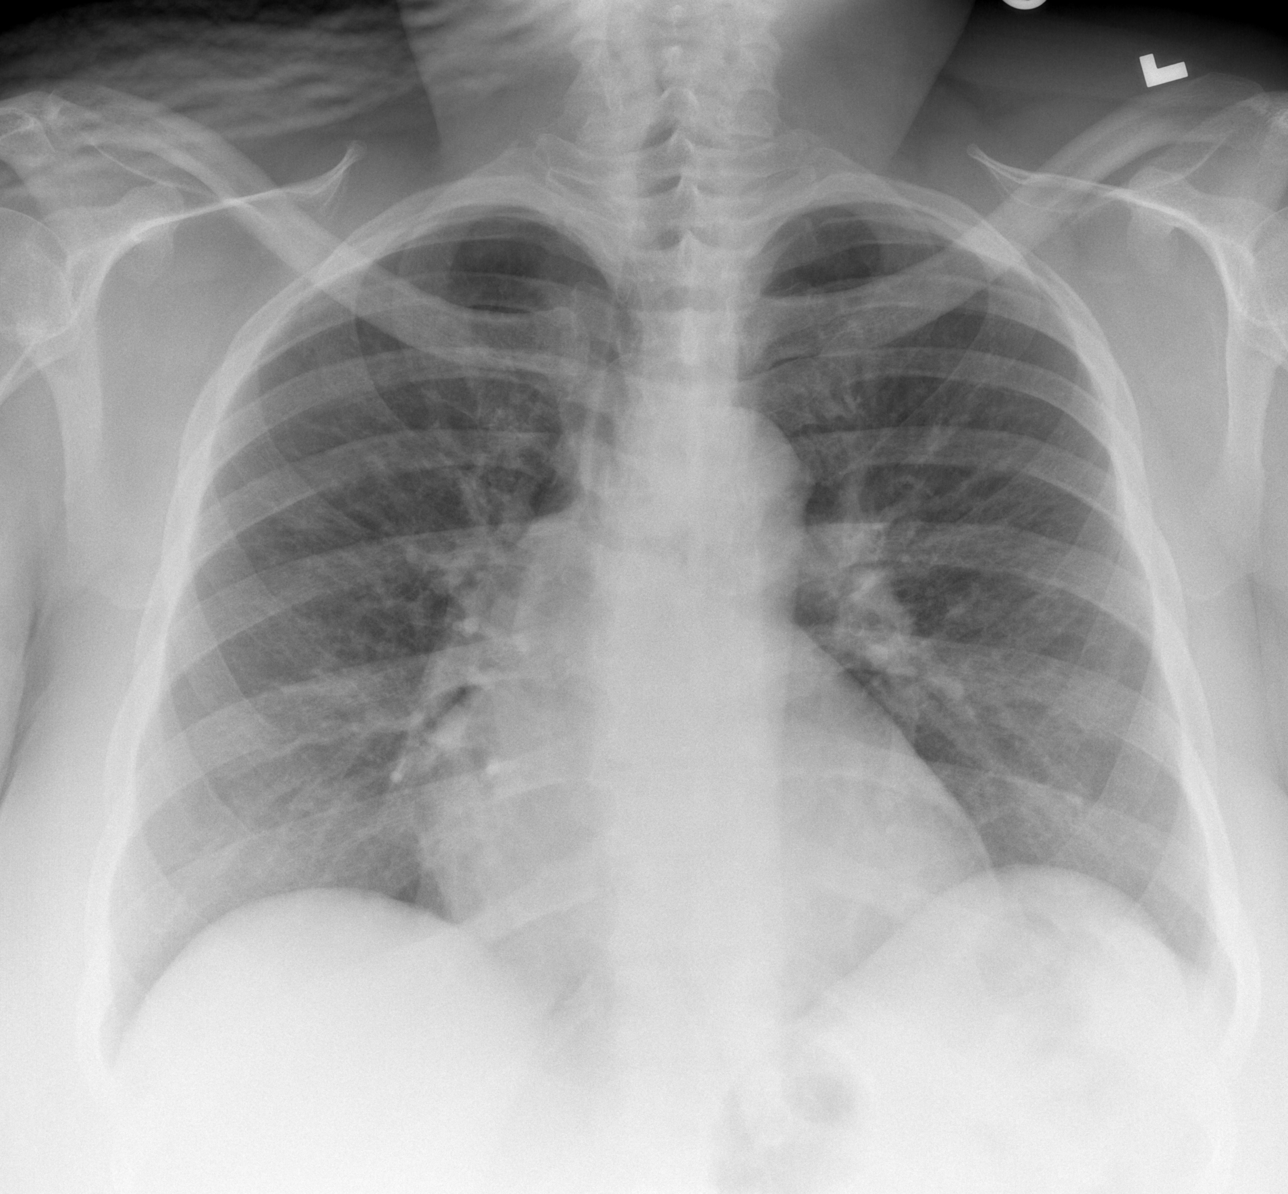

[w ribs ap/pa upper left]
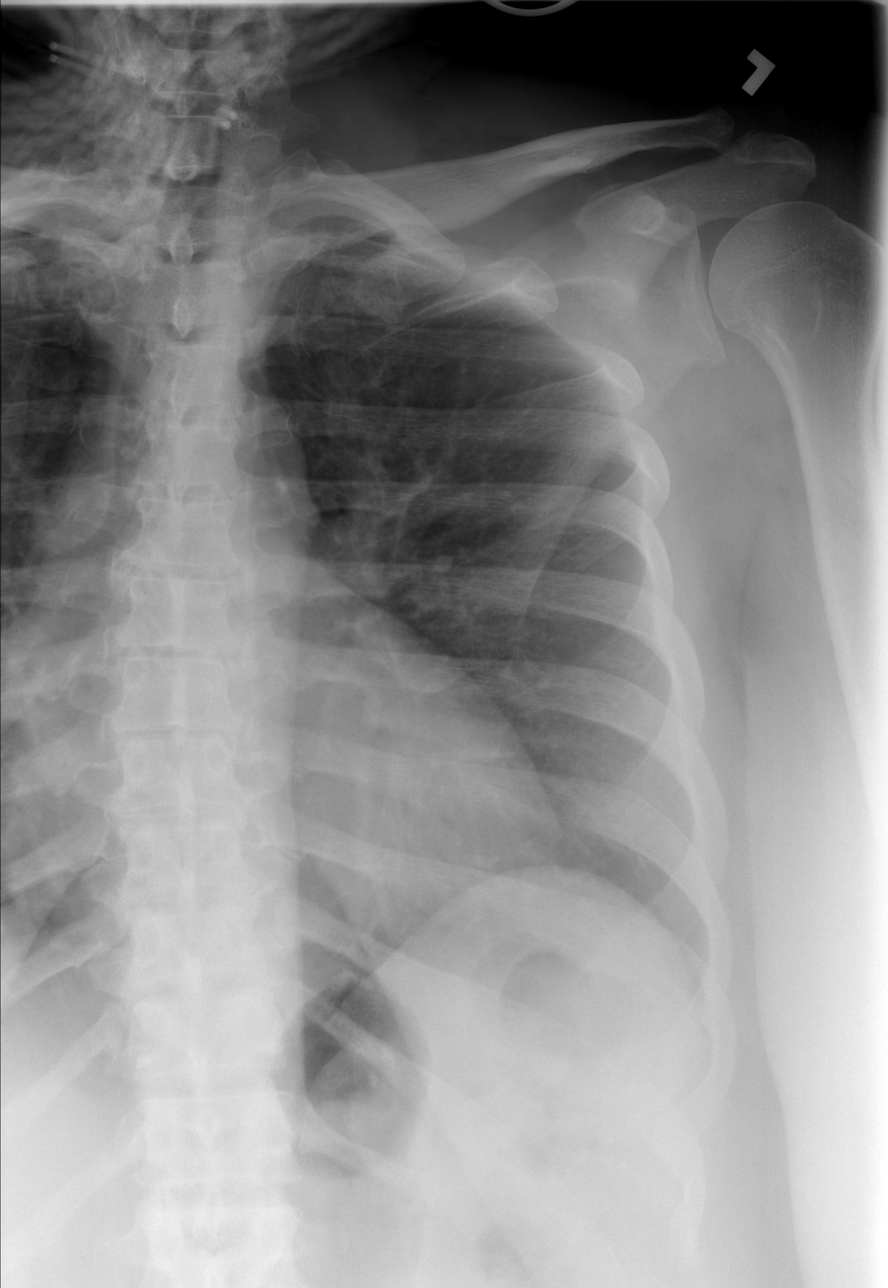

[w ribs ap/pa lower left *]
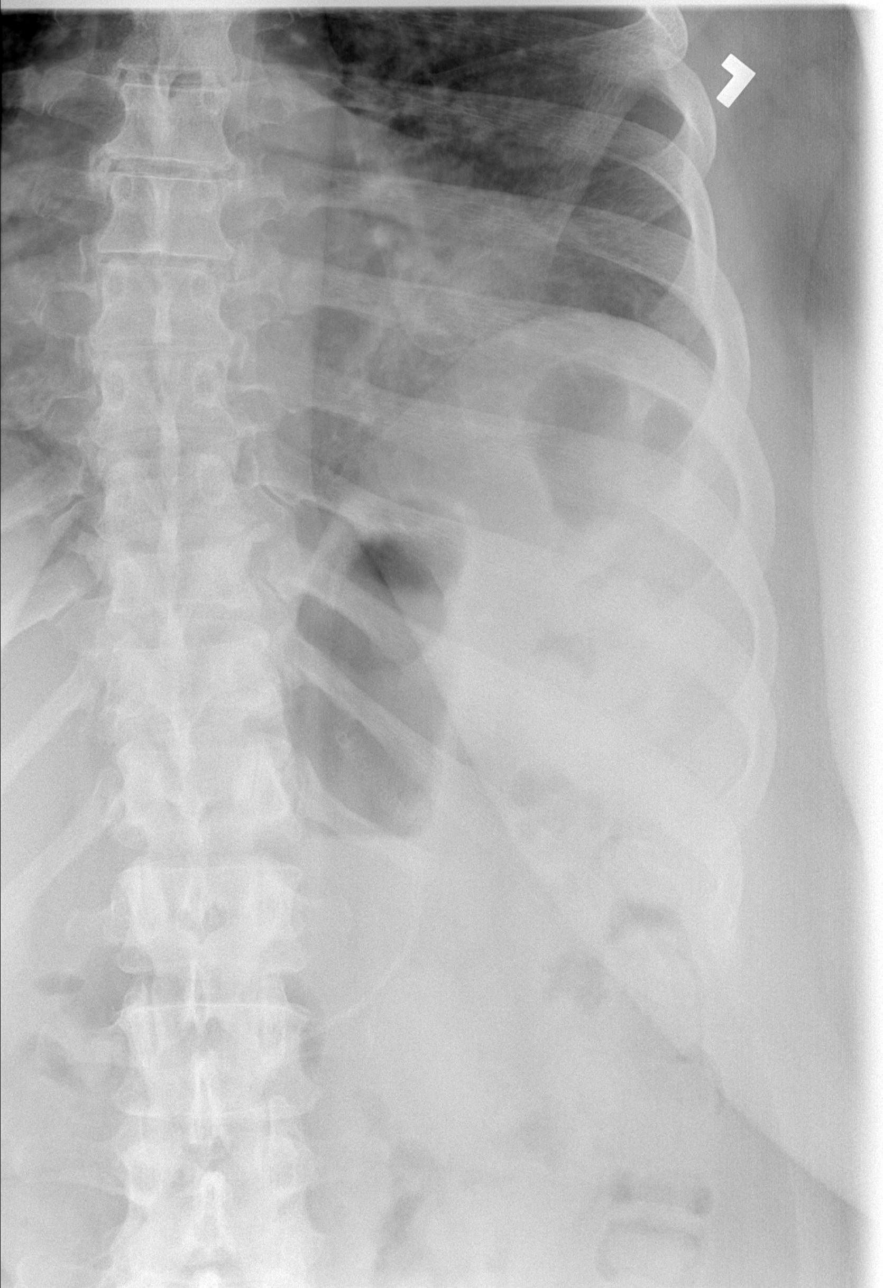

[w ribs oblique left]
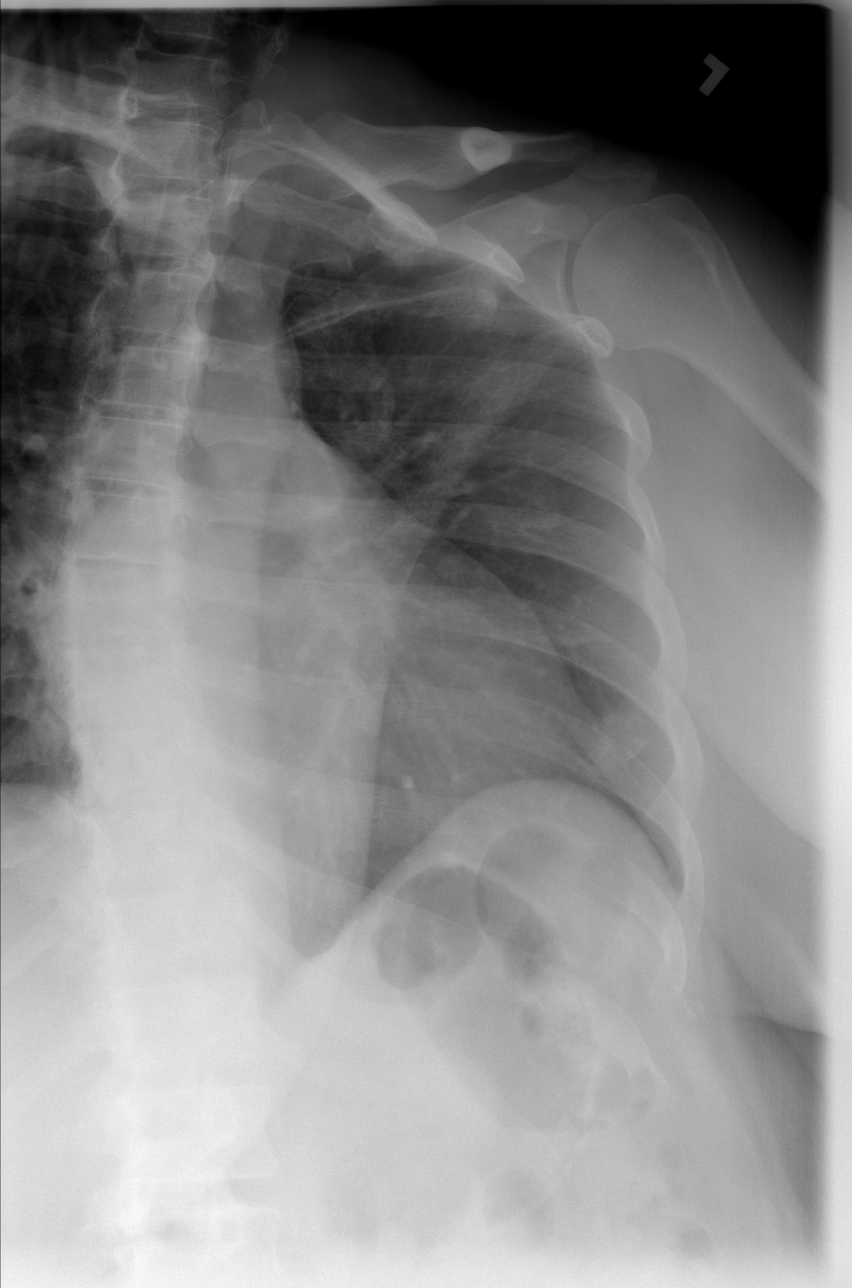

[4 of 4 positions shown; findings below may reference images not displayed]

FINDINGS: The lungs are clear. There is no pleural effusion or pneumothorax.
The cardiac silhouette is within normal limits. No acute osseous
pathology. No definite rib fracture. Surgical suture noted in the
upper abdomen.
IMPRESSION: Negative.

## 2021-11-13 DIAGNOSIS — N8111 Cystocele, midline: Secondary | ICD-10-CM | POA: Insufficient documentation

## 2022-07-27 NOTE — Progress Notes (Unsigned)
Subjective:   I, Peterson Lombard, LAT, ATC acting as a scribe for Lynne Leader, MD.  Chief Complaint: Melinda Bruce,  is a 62 y.o. female who presents for initial evaluation of a head injury. Pt was stuck in an elevator, when returning from lunch to work, that was jerking up and down to different floors. No LOC. Pt was seen at the [facility] ED following the incident c/o low back, neck pain, and HA. Pt has had f/u visits since w/ her PCP at [place]. Today, pt c/o cont'd dizziness, a "mushy" feeling in her head, HA, and nausea. Pt works as a Tourist information centre manager at [employer] and is currently trying to work from home.  She did get referred to an [facility] physical therapy location in [place].  However that physical therapist was not familiar with concussion management or vestibular physical therapy.  She is currently working at home 3 hours/day taking breaks between those hours.   Treatments tried: meclizine, amitriptyline, cyclobenzaprine  Dx imaging: 07/02/22 Head & C-spine CT and L-spine XR  Injury date : 07/02/22 Visit #: 1  History of Present Illness:   Concussion Self-Reported Symptom Score Symptoms rated on a scale 1-6, in last 24 hours  Headache: 4   Pressure in head: 5 Neck pain: 2 Nausea or vomiting: 4 Dizziness: 4  Blurred vision: 4  Balance problems: 3 Sensitivity to light:  5 Sensitivity to noise: 5 Feeling slowed down: 5 Feeling like "in a fog": 5 "Don't feel right": 5 Difficulty concentrating: 5 Difficulty remembering: 5 Fatigue or low energy: 5 Confusion: 4 Drowsiness: 5 More emotional: 0 Irritability: 3 Sadness: 0 Nervous or anxious: 5 Trouble falling asleep: 5   Total # of Symptoms: 20/22 Total Symptom Score: 88/132  Tinnitus: No  Review of Systems: No fevers or chills    Review of History: Prior concussion history.  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  Objective:    Physical  Examination Vitals:   07/30/22 0928 07/30/22 0939  BP: (!) 146/78 (!) 146/78  Pulse: 73   SpO2: 99%    MSK: C-spine decreased cervical motion Neuro: Alert and oriented.  Positive VOMS testing. Psych: Normal speech thought process and affect.  Anxiety and nightmares present.     Imaging:  Procedure Note  Charleston Poot, MD - 07/02/2022 Formatting of this note might be different from the original. INDICATION:    headache,  COMPARISON:   CT head 01/27/21  TECHNIQUE:    Multiple axial images obtained from the skull base to the vertex without IV contrast  were obtained on  07/02/2022 4:12 PM  FINDINGS: There are atherosclerotic calcifications of the cavernous segments of the internal carotid arteries bilaterally.  No evidence of hydrocephalus. No intracranial mass, mass effect or midline shift. No acute infarction evident. No intracranial hemorrhage. Paranasal sinuses: Clear. Mastoid air cells: Clear. Calvarium: Intact.   IMPRESSION: No acute intracranial abnormality.  Electronically Signed by: Charleston Poot on 07/02/2022 4:37 PM Resulting Agency Comment  [id number] Exam End: 07/02/22 16:25   Specimen Collected: 07/02/22 16:34    Procedure Note  Linden Dolin, MD - 07/02/2022 Formatting of this note might be different from the original. CT SPINE CERVICAL WO IV CONTRAST  INDICATION: neck pain  COMPARISON: None Available at Time of Dictation  TECHNIQUE: Thin axial scans were performed through the cervical spine. Multiplanar reconstructions were generated in the sagittal and coronal planes. Radiation dose reduction was utilized (automated exposure control, mA  or kV adjustment based on patient size, or iterative image reconstruction).  FINDINGS: The vertebral body heights are maintained. The intervertebral disc space heights are maintained. No listhesis. No acute fracture. No suspicious osseous abnormalities.  The prevertebral soft tissues are normal without  swelling.   IMPRESSION: 1.  No acute findings.  Electronically Signed by: Linden Dolin, MD on 07/02/2022 4:37 PM Resulting Agency Comment  [id number] Exam End: 07/02/22 16:25   Specimen Collected: 07/02/22 16:34     Assessment and Plan   62 y.o. female with  Concussion and musculoskeletal pain from a elevator accident occurring almost a month ago at work.  She has severe to moderate symptoms in multiple domains.  Additionally she has symptoms that would be typical for PTSD as well.  Headache: Not well-controlled.  Will switch amitriptyline to nortriptyline at bedtime.  This should be more tolerable.  Additionally will use sumatriptan hand as needed.  Vestibular and vision symptoms: Not well-controlled.  Will try to get vestibular physical therapy going at the [facility] location but my backup plan is for vestibular physical therapy at [place] on [address].  Cognitive: Again speech therapy for cognitive rehab at [facility].  Insomnia: Nortriptyline should help.  Mood: Will keep a close eye.  Symptoms are consistent with PTSD.  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  Will continue this for 6 weeks.     Recheck 2 weeks.    Action/Discussion: Reviewed diagnosis, management options, expected outcomes, and the reasons for scheduled and emergent follow-up. Questions were adequately answered. Patient expressed verbal understanding and agreement with the following plan.     Patient Education: Reviewed with patient the risks (i.e, a repeat concussion, post-concussion syndrome, second-impact syndrome) of returning to play prior to complete resolution, and thoroughly reviewed the signs and symptoms of concussion.Reviewed need for complete resolution of all symptoms, with rest AND exertion, prior to return to play. Reviewed red flags for urgent medical evaluation: worsening symptoms, nausea/vomiting, intractable headache,  musculoskeletal changes, focal neurological deficits. [facility] Con[facility]ussion Care Plan, which clearly outlines the plans stated above, was given to patient.   Level of service: Total encounter time 45 minutes including face-to-face time with the patient and, reviewing past medical record, and charting on the date of service.        After Visit Summary printed out and provided to patient as appropriate.  The above documentation has been reviewed and is accurate and complete Lynne Leader

## 2022-07-30 ENCOUNTER — Ambulatory Visit: Payer: Managed Care, Other (non HMO) | Admitting: Family Medicine

## 2022-07-30 VITALS — BP 146/78 | HR 73 | Ht 68.0 in | Wt 225.0 lb

## 2022-07-30 DIAGNOSIS — S060X0A Concussion without loss of consciousness, initial encounter: Secondary | ICD-10-CM

## 2022-07-30 MED ORDER — SUMATRIPTAN SUCCINATE 50 MG PO TABS: 50.0000 mg | ORAL_TABLET | ORAL | 0 refills | Status: DC | PRN

## 2022-07-30 MED ORDER — NORTRIPTYLINE HCL 25 MG PO CAPS: 25.0000 mg | ORAL_CAPSULE | Freq: Every day | ORAL | 2 refills | Status: DC

## 2022-07-30 NOTE — Patient Instructions (Addendum)
Thank you for coming in today.   Medicine: Switch amitriptyline to Nortriptyline.  Try sumitryptan as needed for severe headache.   Plan for vestibular PT and speech therapy for cognitive rehab at [facility].   Let me know if that will not work.   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  Let me know if that should change. I can write a letter or fill out forms.   Recheck in 2 weeks.

## 2022-07-31 ENCOUNTER — Encounter: Payer: Self-pay | Admitting: Family Medicine

## 2022-08-10 NOTE — Progress Notes (Unsigned)
Subjective:   I, Peterson Lombard, PhD, LAT, ATC acting as a scribe for Lynne Leader, MD.  Chief Complaint: Melinda Bruce,  is a 62 y.o. female who presents for f/u concussion. Pt was stuck in an elevator, when returning from lunch to work, that was jerking up and down to different floors. No LOC. Pt was seen at the [facility] ED following the incident. Pt was last seen by Dr. Georgina Snell on 07/30/22 and was switched from amitriptyline to nortriptyline and sumatriptan. Pt was also referred to vestibular and speech therapy and after further investigation, pt sent a MyChart message confirming that her [facility] PT location could provide vestibular and speech therapy. Pt was also advised to cont working from home 3 hrs/day w/ frequent breaks. Today, pt reports she had started all of her therapies and has been working on [employer]. Pt c/o cont'd dizziness, HA, and difficulty concentrating. Pt is scheduled to start counseling. Pt notes nightmares and has added melatonin to her nightly regimen.        Dx imaging: 07/02/22 Head & C-spine CT and L-spine XR   Injury date : 07/02/22 Visit #: 2  History of Present Illness:   Concussion Self-Reported Symptom Score Symptoms rated on a scale 1-6, in last 24 hours  Headache: 3   Pressure in head: 4 Neck pain: 5 Nausea or vomiting: 0 Dizziness: 3  Blurred vision: 3  Balance problems: 0 Sensitivity to light:  3 Sensitivity to noise: 3 Feeling slowed down: 3 Feeling like "in a fog": 3 "Don't feel right": 4 Difficulty concentrating: 4 Difficulty remembering: 3 Fatigue or low energy: 3 Confusion: 3 Drowsiness: 3 More emotional: 3 Irritability: 0 Sadness: 0 Nervous or anxious: 0 Trouble falling asleep: 4   Total # of Symptoms: 17/22 Total Symptom Score: 57/132  Previous Total # of Symptoms: 20/22 Previous Symptom Score: 88/132  Tinnitus: No  Review of Systems: No fevers or chills    Review of History: Diabetes.  Status post gastric  sleeve.  Objective:    Physical Examination Vitals:   08/13/22 0925  BP: 118/80  Pulse: 73  SpO2: 99%   MSK: Normal cervical motion Neuro: Alert and oriented Psych: Normal speech thought process and affect.     Assessment and Plan   62 y.o. female with concussion with vestibular symptoms and headache complicated by PTSD. Overall improving but still very symptomatic.  Headache: Continue nortriptyline and sumatriptan.  Vestibular and ocular symptoms continue vestibular physical therapy.  Cognitive: Speech therapy cognitive rehab  PTSD: Starting therapy/counseling  Work status: Continue to work from home 3 hours/day.  This is currently about as much as she can do.  This may change over the next few weeks as she improves.  Happy to modify work restrictions.  Current work restrictions set to expire on April 8.   Recheck first week of April.  That will be in about 4 weeks.    Action/Discussion: Reviewed diagnosis, management options, expected outcomes, and the reasons for scheduled and emergent follow-up. Questions were adequately answered. Patient expressed verbal understanding and agreement with the following plan.     Patient Education: Reviewed with patient the risks (i.e, a repeat concussion, post-concussion syndrome, second-impact syndrome) of returning to play prior to complete resolution, and thoroughly reviewed the signs and symptoms of concussion.Reviewed need for complete resolution of all symptoms, with rest AND exertion, prior to return to play. Reviewed red flags for urgent medical evaluation: worsening symptoms, nausea/vomiting, intractable headache, musculoskeletal changes, focal neurological deficits. [facility]'s Concussion Care Plan, which clearly outlines the plans stated above, was given to patient.   Level of service: Total encounter time 30 minutes including face-to-face time with the patient and, reviewing past medical record, and charting  on the date of service.        After Visit Summary printed out and provided to patient as appropriate.  The above documentation has been reviewed and is accurate and complete Lynne Leader

## 2022-08-13 ENCOUNTER — Ambulatory Visit (INDEPENDENT_AMBULATORY_CARE_PROVIDER_SITE_OTHER): Payer: Managed Care, Other (non HMO) | Admitting: Family Medicine

## 2022-08-13 VITALS — BP 118/80 | HR 73 | Ht 68.0 in | Wt 231.0 lb

## 2022-08-13 DIAGNOSIS — S060X0D Concussion without loss of consciousness, subsequent encounter: Secondary | ICD-10-CM

## 2022-08-13 DIAGNOSIS — F4310 Post-traumatic stress disorder, unspecified: Secondary | ICD-10-CM | POA: Diagnosis not present

## 2022-08-13 DIAGNOSIS — S060X0A Concussion without loss of consciousness, initial encounter: Secondary | ICD-10-CM | POA: Insufficient documentation

## 2022-08-13 NOTE — Patient Instructions (Signed)
Thank you for coming in today.   Continue current therapy interventions and medications.   Recheck with me before April 8th.   Let me know before then if you change or need me to change your note.   I will be out of the office the last week of March.

## 2022-08-24 ENCOUNTER — Encounter: Payer: Self-pay | Admitting: Family Medicine

## 2022-08-27 NOTE — Telephone Encounter (Signed)
Forwarding to Dr. Georgina Snell as Juluis Rainier (to review when he returns to the office next week).

## 2022-08-28 ENCOUNTER — Other Ambulatory Visit: Payer: Self-pay | Admitting: Family Medicine

## 2022-08-29 NOTE — Telephone Encounter (Signed)
Last OV 08/13/22 Last refill 07/30/22 #30/0

## 2022-08-30 NOTE — Progress Notes (Signed)
Subjective:   I, Peterson Lombard, PhD, LAT, ATC acting as a scribe for Lynne Leader, MD.  Chief Complaint: Melinda Bruce,  is a 62 y.o. female who presents for f/u concussion. Pt was stuck in an elevator, when returning from lunch to work, that was jerking up and down to different floors. No LOC. Pt was seen at the [facility] ED following the incident. Pt was last seen by Dr. Georgina Snell on on 08/13/2022 and was advised to continue nortriptyline, sumatriptan,'s speech therapy, and vestibular therapy.  Her work restrictions were continued to be limited to working from home 3 hours/day.  Patient sent a MyChart message on 08/24/2022 reporting increased dizziness, nausea, headaches.  Today, patient reports 2 occurences where she was holding glass and randomly dropped it. Week before last, started feeling clamy/jittery and then dizziness would set up, denies nausea during these occurences. Meds have been helpful for headache and sleep.   She notes even though her numbers below may look worse overall she is improving and feeling better.  She is increasing her activity level which may explain her symptoms which have worsened a bit.  She is interested in increasing work hours to 4 hours/day up from 3 hours/day.  Speech therapy recommended limiting work sessions to 30 minutes with brief breaks between sessions.  Vestibular physical therapy recommends neuro-ophthalmology consultation.   Dx imaging: 07/02/22 Head & C-spine CT and L-spine XR   Injury date : 07/02/22 Visit #: 3  History of Present Illness:   Concussion Self-Reported Symptom Score Symptoms rated on a scale 1-6, in last 24 hours  Headache: 5   Pressure in head: 2 Neck pain: 0 Nausea or vomiting: 0 Dizziness: 5  Blurred vision: 4  Balance problems: 0 Sensitivity to light:  4 Sensitivity to noise: 2 Feeling slowed down: 5 Feeling like "in a fog": 0 "Don't feel right": 3 Difficulty concentrating: 0 Difficulty remembering: 0 Fatigue or low  energy: 5 Confusion: 0 Drowsiness: 0 More emotional: 3 Irritability: 0 Sadness: 0 Nervous or anxious: 0 Trouble falling asleep: 0   Total # of Symptoms: 10 Total Symptom Score: 33  Previous Total # of Symptoms: 17/22 Previous Symptom Score: 57/132  Tinnitus: No  Review of Systems: No fevers or chills    Review of History: Occasional right arm weakness or dropping objects.  Objective:    Physical Examination Vitals:   09/03/22 0858  BP: 122/80  Pulse: 66  SpO2: 99%   MSK: C-spine: Normal appearing Nontender cervical midline.  Normal cervical motion.  Upper extremity strength reflexes and sensation are equal and normal throughout. Neuro: Alert and oriented normal coordination. Psych: Normal speech thought process and affect.     Imaging:  CT SPINE CERVICAL WO IV CONTRAST  INDICATION: neck pain  COMPARISON: None Available at Time of Dictation  TECHNIQUE: Thin axial scans were performed through the cervical spine. Multiplanar reconstructions were generated in the sagittal and coronal planes. Radiation dose reduction was utilized (automated exposure control, mA or kV adjustment based on patient size, or iterative image reconstruction).  FINDINGS: The vertebral body heights are maintained. The intervertebral disc space heights are maintained. No listhesis. No acute fracture. No suspicious osseous abnormalities.  The prevertebral soft tissues are normal without swelling. Procedure Note  Linden Dolin, MD - 07/02/2022 Formatting of this note might be different from the original. CT SPINE CERVICAL WO IV CONTRAST  INDICATION: neck pain  COMPARISON: None Available at Time of Dictation  TECHNIQUE: Thin axial scans were performed through the  cervical spine. Multiplanar reconstructions were generated in the sagittal and coronal planes. Radiation dose reduction was utilized (automated exposure control, mA or kV adjustment based on patient size, or iterative  image reconstruction).  FINDINGS: The vertebral body heights are maintained. The intervertebral disc space heights are maintained. No listhesis. No acute fracture. No suspicious osseous abnormalities.  The prevertebral soft tissues are normal without swelling.   IMPRESSION: 1.  No acute findings.  Electronically Signed by: Linden Dolin, MD on 07/02/2022 4:37 PM  Assessment and Plan   61 y.o. female with concussion.   She is slowly improving with dedicated rehab and medications.  Plan increased work hours to 4 hours/day working from home with frequent breaks.  This will be valid for the next 1 month.  Continue nortriptyline and sumatriptan for headache control.  Continue speech therapy and vestibular therapy.  Refer to neuro ophthalmology.  Right arm weakness or dropping objects.  Etiology is unclear.  May have some cervical nerve root impingement or perhaps carpal tunnel syndrome.  Watchful waiting for now if not improving proceed with dedicated workup including C-spine MRI and/or nerve conduction study.  Recheck prior to 1 month.      Action/Discussion: Reviewed diagnosis, management options, expected outcomes, and the reasons for scheduled and emergent follow-up. Questions were adequately answered. Patient expressed verbal understanding and agreement with the following plan.     Patient Education: Reviewed with patient the risks (i.e, a repeat concussion, post-concussion syndrome, second-impact syndrome) of returning to play prior to complete resolution, and thoroughly reviewed the signs and symptoms of concussion.Reviewed need for complete resolution of all symptoms, with rest AND exertion, prior to return to play. Reviewed red flags for urgent medical evaluation: worsening symptoms, nausea/vomiting, intractable headache, musculoskeletal changes, focal neurological deficits. [facility] Concussion Care Plan, which clearly outlines the plans stated above,  was given to patient.   Level of service: Total encounter time 30 minutes including face-to-face time with the patient and, reviewing past medical record, and charting on the date of service.        After Visit Summary printed out and provided to patient as appropriate.  The above documentation has been reviewed and is accurate and complete Lynne Leader

## 2022-09-03 ENCOUNTER — Ambulatory Visit (INDEPENDENT_AMBULATORY_CARE_PROVIDER_SITE_OTHER): Payer: Managed Care, Other (non HMO) | Admitting: Family Medicine

## 2022-09-03 ENCOUNTER — Encounter: Payer: Self-pay | Admitting: Family Medicine

## 2022-09-03 VITALS — BP 122/80 | HR 66 | Ht 68.0 in | Wt 227.4 lb

## 2022-09-03 DIAGNOSIS — S060X0D Concussion without loss of consciousness, subsequent encounter: Secondary | ICD-10-CM

## 2022-09-03 MED ORDER — SUMATRIPTAN SUCCINATE 50 MG PO TABS: ORAL_TABLET | ORAL | 3 refills | Status: AC

## 2022-09-03 NOTE — Patient Instructions (Signed)
Thank you for coming in today.   Anticipate nero-optho.   Recheck before 1 month.   Increase work hours to 4 hours per day taking frequent breaks.

## 2022-09-18 MED ORDER — NORTRIPTYLINE HCL 25 MG PO CAPS: 50.0000 mg | ORAL_CAPSULE | Freq: Every day | ORAL | 2 refills | Status: DC

## 2022-09-18 NOTE — Addendum Note (Signed)
Addended by: Rodolph Bong on: 09/18/2022 07:47 AM   Modules accepted: Orders

## 2022-10-08 ENCOUNTER — Encounter: Payer: Self-pay | Admitting: Family Medicine

## 2022-10-08 ENCOUNTER — Ambulatory Visit (INDEPENDENT_AMBULATORY_CARE_PROVIDER_SITE_OTHER): Payer: Worker's Compensation | Admitting: Family Medicine

## 2022-10-08 VITALS — BP 112/70 | HR 72 | Ht 68.0 in | Wt 233.6 lb

## 2022-10-08 DIAGNOSIS — S060X0D Concussion without loss of consciousness, subsequent encounter: Secondary | ICD-10-CM

## 2022-10-08 MED ORDER — NORTRIPTYLINE HCL 50 MG PO CAPS: 50.0000 mg | ORAL_CAPSULE | Freq: Every day | ORAL | 3 refills | Status: AC

## 2022-10-08 NOTE — Patient Instructions (Addendum)
Thank you for coming in today.   Recheck in 1 month.   Expand work to 4-6 h per day.   Continue current doses of medicines.   See if you can send me that Neuro-optho notes attached to a mychart message.

## 2022-10-08 NOTE — Progress Notes (Unsigned)
Subjective:   I, Melinda Riser, PhD, LAT, ATC acting as a scribe for Melinda Graham, MD.  Chief Complaint: Melinda Bruce,  is a 62 y.o. female who presents for f/u concussion. Pt was stuck in an elevator, when returning from lunch to work, that was jerking up and down to different floors. No LOC. Pt was seen at the [facility] ED following the incident. Pt was last seen by Dr. Denyse Amass on 09/03/22 and was advised to increase her work hours to 4 hours/day from home w/ frequent breaks, cont nortriptyline and sumatriptan. She was also advised to cont speech and vestibular therapy and was referred to neuro-ophthalmology.   Today, pt reports continued but slightly improving symptoms.  She is currently working 4 hours/day as noted above.  She thinks she could stretch it some days to 6.  Some days are still bad and she will have trouble working 4 hours.  She does benefit from working from home.  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  She does have the notes on her phone.  She will send the notes to me.  Dx imaging: 07/02/22 Head & C-spine CT and L-spine XR   Injury date: 07/02/22 Visit #: 4  History of Present Illness:   Concussion Self-Reported Symptom Score Symptoms rated on a scale 1-6, in last 24 hours  Headache: 3   Pressure in head: 0 Neck pain: 1 Nausea or vomiting: 0 Dizziness: 3  Blurred vision: 0  Balance problems: 0 Sensitivity to light:  2 Sensitivity to noise: 4 Feeling slowed down: 4 Feeling like "in a fog": 0 "Don't feel right": 2 Difficulty concentrating: 0 Difficulty remembering: 1 Fatigue or low energy: 5 Confusion: 0 Drowsiness: 2 More emotional: 0 Irritability: 0 Sadness: 0 Nervous or anxious: 0 Trouble falling asleep: 0   Total # of Symptoms: 10/22 Total Symptom Score: 27/132  Previous Total # of Symptoms: 10/22 Previous Symptom Score: 33/132  Tinnitus: No  Review of  Systems: No fevers or chills    Review of History: Hypertension and diabetes. She does have a history of a concussion 2 years ago but was neurologically back to normal prior to this recent concussion.  She was feeling fine before she was injured.  Objective:    Physical Examination Vitals:   10/08/22 1435  BP: 112/70  Pulse: 72  SpO2: 98%   MSK: Normal cervical motion Neuro: Alert and oriented.  Positive VOMS testing. Psych: Normal speech thought process and affect.     Assessment and Plan   62 y.o. female with concussion.  Doing pretty well.  She currently is taking nortriptyline for headaches which seems to be working okay.  She slowly is improving and will increase work hours to 4 to 6 hours/day 5 days/week working from home.  This also is important to allow her to take frequent breaks. She will very slowly improved but I think will have a significant or full improvement.  Plan to recheck in 1 month. She notes that she will send me the medical records from her recent visit with neuro-ophthalmology.      Action/Discussion: Reviewed diagnosis, management options, expected outcomes, and the reasons for scheduled and emergent follow-up. Questions were adequately answered. Patient expressed verbal understanding and agreement with the following plan.     Patient Education: Reviewed with patient the risks (i.e, a repeat concussion, post-concussion syndrome, second-impact syndrome) of returning to play prior to complete resolution, and  thoroughly reviewed the signs and symptoms of concussion.Reviewed need for complete resolution of all symptoms, with rest AND exertion, prior to return to play. Reviewed red flags for urgent medical evaluation: worsening symptoms, nausea/vomiting, intractable headache, musculoskeletal changes, focal neurological deficits. [facility] Concussion Care Plan, which clearly outlines the plans stated above, was given to patient.   Level  of service: Total encounter time 30 minutes including face-to-face time with the patient and, reviewing past medical record, and charting on the date of service.        After Visit Summary printed out and provided to patient as appropriate.  The above documentation has been reviewed and is accurate and complete Melinda Bruce

## 2022-10-09 NOTE — Telephone Encounter (Signed)
Forwarding to Dr. Denyse Amass to review.

## 2022-10-15 NOTE — Telephone Encounter (Signed)
Forwarding to Dr. Corey to advise.  

## 2022-10-22 ENCOUNTER — Telehealth: Payer: Self-pay

## 2022-10-22 NOTE — Telephone Encounter (Signed)
Accomodation under ADA Form  Accomodation request  Claim # [id number]  DUE: 10/23/2022

## 2022-10-23 NOTE — Telephone Encounter (Signed)
Called and spoke with patient to review job description/duties. She will e-mail and list of job duties to me within the next hour.

## 2022-10-24 NOTE — Telephone Encounter (Signed)
Placed up front for faxing scanning and to advise pt ready for pick up on 10/23/22.

## 2022-10-25 ENCOUNTER — Encounter: Payer: Self-pay | Admitting: Family Medicine

## 2022-10-25 NOTE — Telephone Encounter (Signed)
Form faxed on 10/23/2022. Called and spoke with pt, she confirmed that they had received the accomodation form.   Pt would like to speak with Dr. Denyse Amass regarding this situation and what is recommended to do next.   Forwarding to Dr. Denyse Amass.

## 2022-10-26 NOTE — Telephone Encounter (Signed)
I spoke with Melinda Bruce they cannot accomidate her work from home on the ADA form.  They will not allow her to work from home with limited hours.  She will see me on June 6.  Will talk about this more.  I advised her to talk to her attorney and I am happy to change her accommodation to what makes sense medically.

## 2022-11-07 ENCOUNTER — Ambulatory Visit (INDEPENDENT_AMBULATORY_CARE_PROVIDER_SITE_OTHER): Payer: Managed Care, Other (non HMO) | Admitting: Family Medicine

## 2022-11-07 ENCOUNTER — Encounter: Payer: Self-pay | Admitting: Family Medicine

## 2022-11-07 VITALS — BP 104/66 | HR 74 | Ht 68.0 in | Wt 237.2 lb

## 2022-11-07 DIAGNOSIS — S060X0D Concussion without loss of consciousness, subsequent encounter: Secondary | ICD-10-CM

## 2022-11-07 DIAGNOSIS — F4310 Post-traumatic stress disorder, unspecified: Secondary | ICD-10-CM

## 2022-11-07 NOTE — Progress Notes (Unsigned)
Subjective:   I, Stevenson Clinch, CMA acting as a scribe for [name], MD.  Chief Complaint: Melinda Bruce,  is a 62 y.o. female who presents for f/u concussion. Pt was stuck in an elevator, when returning from lunch to work, that was jerking up and down to different floors. No LOC. Pt was seen at the [facility] ED following the incident. Pt was last seen by Dr. Denyse Amass on 10/08/22 and was advised to increase work hours to 4 to 6 hours/day, 5 days/week working from home to allow for frequent breaks. She later forwarded the notes from her neuro-ophthalmology visit at [facility]. MyChart messages were exchanged and she Dr. Denyse Amass spoke to her directly on May 24th, noting that her work would no longer allow her to work from home.  Today, pt reports that over all sx have continued to improve. Does have slight flare-up of sx following PT/vestibular rehab. Feels like she has developed better recovery techniques and is able to recover more quickly. Sx notes below reflect sx following vestibular rehab.   Dx imaging: 07/02/22 Head & C-spine CT and L-spine XR    Injury date: 07/02/22 Visit #: 5  History of Present Illness:   Concussion Self-Reported Symptom Score Symptoms rated on a scale 1-6, in last 24 hours  Headache: 3   Pressure in head: 3 Neck pain: 3 Nausea or vomiting: 0 Dizziness: 3  Blurred vision: 3  Balance problems: 3 Sensitivity to light:  0 Sensitivity to noise: 0 Feeling slowed down: 0 Feeling like "in a fog": 0 "Don't feel right": 0 Difficulty concentrating: 0 Difficulty remembering: 0 Fatigue or low energy: 3 Confusion: 0 Drowsiness: 3 More emotional: 0 Irritability: 0 Sadness: 0 Nervous or anxious: 0 Trouble falling asleep: 0   Total # of Symptoms: 8/22 Total Symptom Score: 24/132  Previous Total # of Symptoms: 10/22 Previous Symptom Score: 27/132   Review of Systems:  No fever or chills    Review of History: Hypertension and diabetes.  Objective:     Physical Examination Vitals:   11/07/22 1514  BP: 104/66  Pulse: 74  SpO2: 99%   Neuro: Alert and oriented.  Normal coordination and gait. Psych: Normal speech thought process and affect.   Assessment and Plan   62 y.o. female with concussion.  Still symptomatic and recovering.  As noted above there is a conflict between her current work restrictions and her job.  She previously was working from home 6 hours/day.  However her employer refuses to allow this to continue and demands that she returns to work in person.  They offered her 6 hours/day in person for this required to drive to work and back.  This is in my opinion not acceptable.  She is still symptomatic and adding drive to and from work will be extra challenging and delay or impair her recovery.  From my end I think it is okay if she were to be allowed to work from home but that is not acceptable per her employer.  Recheck in 1 month.        Action/Discussion: Reviewed diagnosis, management options, expected outcomes, and the reasons for scheduled and emergent follow-up. Questions were adequately answered. Patient expressed verbal understanding and agreement with the following plan.     Patient Education: Reviewed with patient the risks (i.e, a repeat concussion, post-concussion syndrome, second-impact syndrome) of returning to play prior to complete resolution, and thoroughly reviewed the signs and symptoms of concussion.Reviewed need for complete resolution of all  symptoms, with rest AND exertion, prior to return to play. Reviewed red flags for urgent medical evaluation: worsening symptoms, nausea/vomiting, intractable headache, musculoskeletal changes, focal neurological deficits. [facility] Concussion Care Plan, which clearly outlines the plans stated above, was given to patient.   Level of service: Total encounter time 30 minutes including face-to-face time with the patient and, reviewing past medical  record, and charting on the date of service.        After Visit Summary printed out and provided to patient as appropriate.  The above documentation has been reviewed and is accurate and complete [name]

## 2022-11-07 NOTE — Patient Instructions (Signed)
Thank you for coming in today.   Recheck in 1 month.   Continue PT.   Ok to stretch it a little and exercise and do normal activity as tolerated.

## 2022-11-09 NOTE — Telephone Encounter (Signed)
Complete form Provide notes/treatment plan from 07/04/22-present  Due 11/13/22  FAX: [phone]

## 2022-11-13 NOTE — Telephone Encounter (Signed)
Called pt and left VM to call the office.   Called [employer], requested extension through the end of business day tomorrow, extension granted.

## 2022-11-13 NOTE — Telephone Encounter (Signed)
Form completed and placed on Dr. Corey's desk to review and sign.  

## 2022-11-13 NOTE — Telephone Encounter (Signed)
Pt attached letter requesting that accomodation request be included with form.   I will need to reach out to pt to discuss.

## 2022-11-14 NOTE — Telephone Encounter (Signed)
Faxed 11/14/2022

## 2022-11-14 NOTE — Telephone Encounter (Signed)
Also included note advising that workplace accommodations had been denied.

## 2022-11-14 NOTE — Telephone Encounter (Signed)
Form completed, reviewed and signed by Dr. Denyse Amass and placed to the front desk for faxing/scanning.

## 2022-11-19 ENCOUNTER — Telehealth: Payer: Self-pay | Admitting: Family Medicine

## 2022-11-19 NOTE — Telephone Encounter (Signed)
Called and spoke with patient. I advised that forms and letter from Dr. Denyse Amass had been sent back. She requests that a copy of the form and letter be left at the front desk for her or her husband to pick up.

## 2022-11-19 NOTE — Telephone Encounter (Signed)
Patient called stating that she was returning a call to [place].  Please advise.  Patient can be reached at [phone].

## 2022-11-20 NOTE — Telephone Encounter (Signed)
Copy of forms placed up front for pt to pick up.

## 2022-12-05 ENCOUNTER — Ambulatory Visit: Payer: Managed Care, Other (non HMO) | Admitting: Family Medicine

## 2022-12-05 VITALS — BP 136/78 | Ht 68.0 in | Wt 234.0 lb

## 2022-12-05 DIAGNOSIS — S060X0D Concussion without loss of consciousness, subsequent encounter: Secondary | ICD-10-CM | POA: Diagnosis not present

## 2022-12-05 NOTE — Progress Notes (Signed)
   Rubin Payor, PhD, LAT, ATC acting as a scribe for Melinda Graham, MD.  Melinda Bruce is a 62 y.o. female who presents to [facility] today for f/u concussion. On January 29th, she was stuck in an elevator, when returning from lunch to work, that was jerking up and down to different floors. No LOC. Pt was seen at the [facility] ED following the incident. Pt was last seen by Dr. Denyse Amass on 11/07/22.  Today, pt reports she is feeling somewhat better, making progress. Less frequent HA and dizziness. May 23rd was her last day of her job being able to work from home. She has continued all PT's. Father's Day weekend she had some nausea and vomiting. She recently had "back injections."  Her employer would not allow her to return to work with work from home restrictions.  She is starting ocular rehab now which will culminate in early September.  She has a follow-up appointment with neurology September 17.  Dx imaging: 07/02/22 Head & C-spine CT and L-spine XR   Pertinent review of systems: No fevers or chills  Relevant historical information: Hypertension and diabetes.   Exam:  BP 136/78   Ht 5\' 8"  (1.727 m)   Wt 234 lb (106.1 kg)   LMP 09/20/2011   BMI 35.58 kg/m  General: Well Developed, well nourished, and in no acute distress.   MSK: Normal cervical motion. Neurology: Normal coordination.  Positive VOMS testing.     Assessment and Plan: 62 y.o. female with symptoms secondary to concussion occurring several months ago.  She is improving but still symptomatic.  She is not able to return to work with requirements mandated by her employer.  Therefore she is unable to work.  Will continue to allow vestibular and ocular therapy to work which I think will be quite helpful.  Anticipate return to work October 3.  Check back with me after her follow-up appoint with neurology on September 17.  If she improves sooner she can return to work sooner but will have a  conversation about it.  Total encounter time 20 minutes including face-to-face time with the patient and, reviewing past medical record, and charting on the date of service.    PDMP not reviewed this encounter. No orders of the defined types were placed in this encounter.  No orders of the defined types were placed in this encounter.    Discussed warning signs or symptoms. Please see discharge instructions. Patient expresses understanding.   The above documentation has been reviewed and is accurate and complete Melinda Bruce, M.D.

## 2022-12-05 NOTE — Patient Instructions (Addendum)
Thank you for coming in today.   Recheck with me following your neurology appointment on Sep 17.   Anticipate return to work date 03/07/23 or sooner if you get better sooner.   Please me know if you need anything.

## 2022-12-10 ENCOUNTER — Telehealth: Payer: Self-pay

## 2022-12-10 NOTE — Telephone Encounter (Signed)
Form completed and placed to Dr. Zollie Pee desk to review and sign.

## 2022-12-10 NOTE — Telephone Encounter (Signed)
FMLA / ADA form received  Date received: 12/10/22 Due date:   Applicable dates of service: 08/27/22 - present

## 2022-12-10 NOTE — Telephone Encounter (Signed)
Forms faxed successfully

## 2022-12-10 NOTE — Telephone Encounter (Signed)
Form placed at the front desk for faxing/scanning.  

## 2022-12-13 ENCOUNTER — Encounter: Payer: Self-pay | Admitting: Family Medicine

## 2022-12-13 ENCOUNTER — Telehealth: Payer: Self-pay

## 2022-12-13 NOTE — Telephone Encounter (Signed)
Form reviewed and signed by Dr. Denyse Amass and placed at the front desk for faxing/scanning.

## 2022-12-13 NOTE — Telephone Encounter (Signed)
Form completed and placed on Dr. Corey's desk to review and sign.  

## 2022-12-13 NOTE — Telephone Encounter (Signed)
Physician Progress Report  Received 12/12/22 Due 12/14/22

## 2022-12-18 ENCOUNTER — Encounter: Payer: Self-pay | Admitting: Family Medicine

## 2022-12-24 NOTE — Telephone Encounter (Signed)
Hi Melinda Bruce Can you please email me the latest forms that you completed for Dr Kandee Keen?  I'm completely some additional disability form that is was given to cover me from 8/21 to October I believe. I want to use the current forms since you just completed them.  Thanks  [employer]

## 2022-12-27 ENCOUNTER — Telehealth: Payer: Self-pay

## 2022-12-27 NOTE — Telephone Encounter (Signed)
ADA form received from [facility]  Received on 12/27/22 Due: 12/31/22  Claim ID# [id number]

## 2022-12-31 NOTE — Telephone Encounter (Signed)
Form completed and faxed back to [employer].

## 2023-02-21 ENCOUNTER — Ambulatory Visit (INDEPENDENT_AMBULATORY_CARE_PROVIDER_SITE_OTHER): Payer: Self-pay | Admitting: Family Medicine

## 2023-02-21 VITALS — BP 132/78 | HR 72 | Ht 68.0 in | Wt 235.0 lb

## 2023-02-21 DIAGNOSIS — S060X0D Concussion without loss of consciousness, subsequent encounter: Secondary | ICD-10-CM | POA: Diagnosis not present

## 2023-02-21 NOTE — Patient Instructions (Addendum)
Thank you for coming in today.   Let me know how this goes.   Recheck with mas needed.

## 2023-02-21 NOTE — Progress Notes (Signed)
   Rubin Payor, PhD, LAT, ATC acting as a scribe for [name], MD.  Melinda Bruce is a 62 y.o. female who presents to [facility] at [facility] today for  f/u concussion. On January 29th, she was stuck in an elevator, when returning from lunch to work, that was jerking up and down to different floors. No LOC. Pt was seen at the [facility] ED following the incident. Pt was last seen by Dr. Denyse Amass on 12/05/22 and was advised to cont vestibular and ocular therapy. Pt was seen by Dr. Everlena Cooper at [facility] on Sept 17th.  Today, pt reports she is feeling great. No symptoms to report. She is still out of work and will need her paperwork revised to be able to return. She is continuing vision therapy and making progress.  Dx imaging: 07/02/22 Head & C-spine CT and L-spine XR   Pertinent review of systems: No fevers or chills  Relevant historical information: Hypertension and diabetes   Exam:  BP 132/78   Pulse 72   Ht 5\' 8"  (1.727 m)   Wt 235 lb (106.6 kg)   LMP 09/20/2011   SpO2 100%   BMI 35.73 kg/m  General: Well Developed, well nourished, and in no acute distress.   Neuropsych: Alert and oriented normal coordination.     Assessment and Plan: 62 y.o. female with resolving concussion symptoms.  She has had significant benefit with neuro optometry for eye rehab.  Her headache is much better controlled on 100 mg of Topamax at bedtime.  She would like to return to work next week [employer] date September 30 with a plan to return to work 6 hours/day for 2 weeks and then increase to full-time thereafter.  If you feel is feasible for her.  Will need to update long-term disability as she is returning to work.  Check back with me as needed.   PDMP not reviewed this encounter. No orders of the defined types were placed in this encounter.  No orders of the defined types were placed in this encounter.    Discussed warning signs or symptoms. Please see  discharge instructions. Patient expresses understanding.   The above documentation has been reviewed and is accurate and complete [name], M.D. Total encounter time 20 minutes including face-to-face time with the patient and, reviewing past medical record, and charting on the date of service.

## 2023-03-04 ENCOUNTER — Telehealth: Payer: Self-pay

## 2023-03-04 NOTE — Telephone Encounter (Signed)
Called to to advise of extension for paperwork. Pt verbalized understanding. She confirmed return to work date of 03/04/23 starting with 6 hours per day, 5 days per week for the first 2 weeks, then back full time.

## 2023-03-04 NOTE — Telephone Encounter (Signed)
Disability form received from [employer] with due date of 02/26/2024. I did not receive form until 03/01/23.   Called [employer] to request extension for disability form submission.   OK to complete and  return by end of business day tomorrow.

## 2023-03-05 NOTE — Telephone Encounter (Signed)
Form completed and placed on Dr. Corey's desk to review and sign.  

## 2023-03-06 NOTE — Telephone Encounter (Signed)
Form reviewed and signed by Dr. Denyse Amass and placed st the front for faxing.   Fax confirmation received.
